# Patient Record
Sex: Female | Born: 1945 | Race: White | Hispanic: No | Marital: Married | Smoking: Never smoker
Health system: Southern US, Community
[De-identification: ages and names within clinical notes are randomized; demographics above are authoritative.]

## PROBLEM LIST (undated history)

## (undated) DIAGNOSIS — J302 Other seasonal allergic rhinitis: Secondary | ICD-10-CM

## (undated) DIAGNOSIS — Z9221 Personal history of antineoplastic chemotherapy: Secondary | ICD-10-CM

## (undated) DIAGNOSIS — Z923 Personal history of irradiation: Secondary | ICD-10-CM

## (undated) DIAGNOSIS — G250 Essential tremor: Secondary | ICD-10-CM

## (undated) DIAGNOSIS — C50919 Malignant neoplasm of unspecified site of unspecified female breast: Secondary | ICD-10-CM

## (undated) DIAGNOSIS — D649 Anemia, unspecified: Secondary | ICD-10-CM

## (undated) DIAGNOSIS — I10 Essential (primary) hypertension: Secondary | ICD-10-CM

## (undated) DIAGNOSIS — Z8489 Family history of other specified conditions: Secondary | ICD-10-CM

## (undated) DIAGNOSIS — E7800 Pure hypercholesterolemia, unspecified: Secondary | ICD-10-CM

## (undated) DIAGNOSIS — M1990 Unspecified osteoarthritis, unspecified site: Secondary | ICD-10-CM

## (undated) HISTORY — DX: Malignant neoplasm of unspecified site of unspecified female breast: C50.919

## (undated) HISTORY — DX: Other seasonal allergic rhinitis: J30.2

## (undated) HISTORY — DX: Personal history of antineoplastic chemotherapy: Z92.21

## (undated) HISTORY — DX: Essential tremor: G25.0

## (undated) HISTORY — PX: DILATION AND CURETTAGE OF UTERUS: SHX78

## (undated) HISTORY — PX: OTHER SURGICAL HISTORY: SHX169

## (undated) HISTORY — PX: BREAST RECONSTRUCTION: SHX9

## (undated) HISTORY — DX: Pure hypercholesterolemia, unspecified: E78.00

## (undated) HISTORY — DX: Essential (primary) hypertension: I10

## (undated) HISTORY — PX: WISDOM TOOTH EXTRACTION: SHX21

## (undated) HISTORY — DX: Personal history of irradiation: Z92.3

---

## 1998-01-18 ENCOUNTER — Ambulatory Visit (HOSPITAL_COMMUNITY): Admission: RE | Admit: 1998-01-18 | Discharge: 1998-01-18 | Payer: Self-pay | Admitting: Obstetrics and Gynecology

## 1998-01-18 ENCOUNTER — Encounter: Payer: Self-pay | Admitting: Obstetrics and Gynecology

## 1998-11-27 ENCOUNTER — Other Ambulatory Visit: Admission: RE | Admit: 1998-11-27 | Discharge: 1998-11-27 | Payer: Self-pay | Admitting: Obstetrics and Gynecology

## 1999-02-07 ENCOUNTER — Encounter: Payer: Self-pay | Admitting: Obstetrics and Gynecology

## 1999-02-07 ENCOUNTER — Ambulatory Visit (HOSPITAL_COMMUNITY): Admission: RE | Admit: 1999-02-07 | Discharge: 1999-02-07 | Payer: Self-pay | Admitting: Obstetrics and Gynecology

## 2000-02-21 ENCOUNTER — Encounter: Payer: Self-pay | Admitting: Obstetrics and Gynecology

## 2000-02-21 ENCOUNTER — Ambulatory Visit (HOSPITAL_COMMUNITY): Admission: RE | Admit: 2000-02-21 | Discharge: 2000-02-21 | Payer: Self-pay | Admitting: Obstetrics and Gynecology

## 2000-02-28 ENCOUNTER — Encounter: Admission: RE | Admit: 2000-02-28 | Discharge: 2000-02-28 | Payer: Self-pay | Admitting: Obstetrics and Gynecology

## 2000-02-28 ENCOUNTER — Encounter: Payer: Self-pay | Admitting: Obstetrics and Gynecology

## 2000-04-30 ENCOUNTER — Other Ambulatory Visit: Admission: RE | Admit: 2000-04-30 | Discharge: 2000-04-30 | Payer: Self-pay | Admitting: Obstetrics and Gynecology

## 2000-10-09 ENCOUNTER — Encounter: Admission: RE | Admit: 2000-10-09 | Discharge: 2000-10-09 | Payer: Self-pay | Admitting: Obstetrics and Gynecology

## 2000-10-09 ENCOUNTER — Encounter: Payer: Self-pay | Admitting: Obstetrics and Gynecology

## 2001-03-13 ENCOUNTER — Encounter: Admission: RE | Admit: 2001-03-13 | Discharge: 2001-03-13 | Payer: Self-pay | Admitting: Obstetrics and Gynecology

## 2001-03-13 ENCOUNTER — Encounter: Payer: Self-pay | Admitting: Obstetrics and Gynecology

## 2001-06-25 ENCOUNTER — Other Ambulatory Visit: Admission: RE | Admit: 2001-06-25 | Discharge: 2001-06-25 | Payer: Self-pay | Admitting: Obstetrics and Gynecology

## 2002-09-03 ENCOUNTER — Encounter: Admission: RE | Admit: 2002-09-03 | Discharge: 2002-09-03 | Payer: Self-pay | Admitting: Obstetrics and Gynecology

## 2002-09-03 ENCOUNTER — Encounter: Payer: Self-pay | Admitting: Obstetrics and Gynecology

## 2002-09-21 ENCOUNTER — Encounter: Payer: Self-pay | Admitting: Obstetrics and Gynecology

## 2002-09-21 ENCOUNTER — Encounter (INDEPENDENT_AMBULATORY_CARE_PROVIDER_SITE_OTHER): Payer: Self-pay | Admitting: Specialist

## 2002-09-21 ENCOUNTER — Encounter: Admission: RE | Admit: 2002-09-21 | Discharge: 2002-09-21 | Payer: Self-pay | Admitting: Obstetrics and Gynecology

## 2002-09-29 ENCOUNTER — Encounter: Payer: Self-pay | Admitting: Surgery

## 2002-09-29 ENCOUNTER — Ambulatory Visit (HOSPITAL_COMMUNITY): Admission: RE | Admit: 2002-09-29 | Discharge: 2002-09-29 | Payer: Self-pay | Admitting: Surgery

## 2002-09-30 ENCOUNTER — Encounter: Payer: Self-pay | Admitting: Surgery

## 2002-10-15 ENCOUNTER — Encounter: Payer: Self-pay | Admitting: Surgery

## 2002-10-15 ENCOUNTER — Encounter: Admission: RE | Admit: 2002-10-15 | Discharge: 2002-10-15 | Payer: Self-pay | Admitting: Surgery

## 2002-10-18 ENCOUNTER — Encounter (INDEPENDENT_AMBULATORY_CARE_PROVIDER_SITE_OTHER): Payer: Self-pay | Admitting: *Deleted

## 2002-10-18 ENCOUNTER — Ambulatory Visit (HOSPITAL_BASED_OUTPATIENT_CLINIC_OR_DEPARTMENT_OTHER): Admission: RE | Admit: 2002-10-18 | Discharge: 2002-10-18 | Payer: Self-pay | Admitting: Surgery

## 2002-10-18 ENCOUNTER — Encounter: Admission: RE | Admit: 2002-10-18 | Discharge: 2002-10-18 | Payer: Self-pay | Admitting: Surgery

## 2002-10-18 ENCOUNTER — Encounter: Payer: Self-pay | Admitting: Surgery

## 2002-10-18 HISTORY — PX: BREAST LUMPECTOMY: SHX2

## 2002-11-02 ENCOUNTER — Encounter: Admission: RE | Admit: 2002-11-02 | Discharge: 2002-11-02 | Payer: Self-pay

## 2002-11-23 ENCOUNTER — Ambulatory Visit: Admission: RE | Admit: 2002-11-23 | Discharge: 2003-02-02 | Payer: Self-pay | Admitting: Radiation Oncology

## 2002-11-25 ENCOUNTER — Other Ambulatory Visit: Admission: RE | Admit: 2002-11-25 | Discharge: 2002-11-25 | Payer: Self-pay | Admitting: Obstetrics and Gynecology

## 2003-02-22 ENCOUNTER — Ambulatory Visit: Admission: RE | Admit: 2003-02-22 | Discharge: 2003-02-22 | Payer: Self-pay | Admitting: Radiation Oncology

## 2003-03-14 ENCOUNTER — Ambulatory Visit: Admission: RE | Admit: 2003-03-14 | Discharge: 2003-03-14 | Payer: Self-pay | Admitting: Radiation Oncology

## 2003-03-15 ENCOUNTER — Ambulatory Visit: Admission: RE | Admit: 2003-03-15 | Discharge: 2003-03-15 | Payer: Self-pay | Admitting: Radiation Oncology

## 2003-08-16 ENCOUNTER — Ambulatory Visit: Admission: RE | Admit: 2003-08-16 | Discharge: 2003-08-16 | Payer: Self-pay | Admitting: Radiation Oncology

## 2004-02-06 ENCOUNTER — Ambulatory Visit (HOSPITAL_COMMUNITY): Admission: RE | Admit: 2004-02-06 | Discharge: 2004-02-06 | Payer: Self-pay | Admitting: Obstetrics and Gynecology

## 2004-02-10 ENCOUNTER — Encounter: Admission: RE | Admit: 2004-02-10 | Discharge: 2004-02-10 | Payer: Self-pay | Admitting: Obstetrics and Gynecology

## 2004-03-02 ENCOUNTER — Encounter: Admission: RE | Admit: 2004-03-02 | Discharge: 2004-03-02 | Payer: Self-pay | Admitting: Obstetrics and Gynecology

## 2005-04-30 ENCOUNTER — Encounter: Admission: RE | Admit: 2005-04-30 | Discharge: 2005-04-30 | Payer: Self-pay | Admitting: Surgery

## 2006-05-02 ENCOUNTER — Encounter: Admission: RE | Admit: 2006-05-02 | Discharge: 2006-05-02 | Payer: Self-pay | Admitting: Surgery

## 2006-05-09 ENCOUNTER — Encounter: Admission: RE | Admit: 2006-05-09 | Discharge: 2006-05-09 | Payer: Self-pay | Admitting: Family Medicine

## 2006-06-19 ENCOUNTER — Ambulatory Visit (HOSPITAL_BASED_OUTPATIENT_CLINIC_OR_DEPARTMENT_OTHER): Admission: RE | Admit: 2006-06-19 | Discharge: 2006-06-19 | Payer: Self-pay | Admitting: Surgery

## 2006-06-19 ENCOUNTER — Encounter (INDEPENDENT_AMBULATORY_CARE_PROVIDER_SITE_OTHER): Payer: Self-pay | Admitting: *Deleted

## 2006-06-23 ENCOUNTER — Ambulatory Visit: Payer: Self-pay | Admitting: Oncology

## 2006-06-24 ENCOUNTER — Ambulatory Visit: Admission: RE | Admit: 2006-06-24 | Discharge: 2006-09-10 | Payer: Self-pay | Admitting: Radiation Oncology

## 2006-07-22 LAB — CBC WITH DIFFERENTIAL/PLATELET
Basophils Absolute: 0 10*3/uL (ref 0.0–0.1)
Eosinophils Absolute: 0.1 10*3/uL (ref 0.0–0.5)
HCT: 39.3 % (ref 34.8–46.6)
HGB: 14 g/dL (ref 11.6–15.9)
MCV: 85.7 fL (ref 81.0–101.0)
MONO%: 6.7 % (ref 0.0–13.0)
NEUT#: 3.8 10*3/uL (ref 1.5–6.5)
RDW: 13.6 % (ref 11.3–14.5)

## 2006-07-22 LAB — COMPREHENSIVE METABOLIC PANEL
Albumin: 4.7 g/dL (ref 3.5–5.2)
Alkaline Phosphatase: 80 U/L (ref 39–117)
BUN: 29 mg/dL — ABNORMAL HIGH (ref 6–23)
Calcium: 10.1 mg/dL (ref 8.4–10.5)
Chloride: 105 mEq/L (ref 96–112)
Glucose, Bld: 99 mg/dL (ref 70–99)
Potassium: 5.5 mEq/L — ABNORMAL HIGH (ref 3.5–5.3)

## 2006-07-29 LAB — COMPREHENSIVE METABOLIC PANEL
Alkaline Phosphatase: 75 U/L (ref 39–117)
BUN: 19 mg/dL (ref 6–23)
Glucose, Bld: 110 mg/dL — ABNORMAL HIGH (ref 70–99)
Sodium: 140 mEq/L (ref 135–145)
Total Bilirubin: 0.8 mg/dL (ref 0.3–1.2)
Total Protein: 6.9 g/dL (ref 6.0–8.3)

## 2006-08-01 ENCOUNTER — Encounter: Admission: RE | Admit: 2006-08-01 | Discharge: 2006-08-01 | Payer: Self-pay | Admitting: Oncology

## 2006-08-11 ENCOUNTER — Ambulatory Visit: Payer: Self-pay | Admitting: Oncology

## 2006-09-04 ENCOUNTER — Ambulatory Visit (HOSPITAL_COMMUNITY): Admission: RE | Admit: 2006-09-04 | Discharge: 2006-09-05 | Payer: Self-pay | Admitting: Surgery

## 2006-09-04 ENCOUNTER — Encounter (INDEPENDENT_AMBULATORY_CARE_PROVIDER_SITE_OTHER): Payer: Self-pay | Admitting: Surgery

## 2006-09-04 HISTORY — PX: MASTECTOMY: SHX3

## 2006-09-26 ENCOUNTER — Ambulatory Visit: Payer: Self-pay | Admitting: Oncology

## 2006-10-02 ENCOUNTER — Ambulatory Visit (HOSPITAL_COMMUNITY): Admission: RE | Admit: 2006-10-02 | Discharge: 2006-10-02 | Payer: Self-pay | Admitting: Oncology

## 2006-10-02 LAB — CANCER ANTIGEN 27.29: CA 27.29: 10 U/mL (ref 0–39)

## 2006-10-02 LAB — COMPREHENSIVE METABOLIC PANEL
Alkaline Phosphatase: 82 U/L (ref 39–117)
BUN: 31 mg/dL — ABNORMAL HIGH (ref 6–23)
Glucose, Bld: 104 mg/dL — ABNORMAL HIGH (ref 70–99)
Total Bilirubin: 0.8 mg/dL (ref 0.3–1.2)

## 2006-10-02 LAB — CBC WITH DIFFERENTIAL/PLATELET
Basophils Absolute: 0 10*3/uL (ref 0.0–0.1)
Eosinophils Absolute: 0.1 10*3/uL (ref 0.0–0.5)
HGB: 13.1 g/dL (ref 11.6–15.9)
LYMPH%: 30.6 % (ref 14.0–48.0)
MCV: 85.9 fL (ref 81.0–101.0)
MONO%: 6.7 % (ref 0.0–13.0)
NEUT#: 2.9 10*3/uL (ref 1.5–6.5)
Platelets: 192 10*3/uL (ref 145–400)

## 2006-10-03 ENCOUNTER — Ambulatory Visit (HOSPITAL_BASED_OUTPATIENT_CLINIC_OR_DEPARTMENT_OTHER): Admission: RE | Admit: 2006-10-03 | Discharge: 2006-10-03 | Payer: Self-pay | Admitting: Surgery

## 2006-10-06 ENCOUNTER — Ambulatory Visit: Payer: Self-pay

## 2006-10-10 ENCOUNTER — Ambulatory Visit (HOSPITAL_COMMUNITY): Admission: RE | Admit: 2006-10-10 | Discharge: 2006-10-10 | Payer: Self-pay | Admitting: Oncology

## 2006-10-24 LAB — CBC WITH DIFFERENTIAL/PLATELET
BASO%: 1.1 % (ref 0.0–2.0)
EOS%: 0.3 % (ref 0.0–7.0)
HCT: 38.5 % (ref 34.8–46.6)
MCH: 30.2 pg (ref 26.0–34.0)
MCHC: 36 g/dL (ref 32.0–36.0)
MONO#: 1.7 10*3/uL — ABNORMAL HIGH (ref 0.1–0.9)
NEUT%: 78.2 % — ABNORMAL HIGH (ref 39.6–76.8)
RDW: 10.1 % — ABNORMAL LOW (ref 11.3–14.5)
WBC: 19.1 10*3/uL — ABNORMAL HIGH (ref 3.9–10.0)
lymph#: 2.3 10*3/uL (ref 0.9–3.3)

## 2006-10-31 LAB — CBC WITH DIFFERENTIAL/PLATELET
Basophils Absolute: 0.1 10*3/uL (ref 0.0–0.1)
EOS%: 0.5 % (ref 0.0–7.0)
Eosinophils Absolute: 0 10*3/uL (ref 0.0–0.5)
HCT: 33.6 % — ABNORMAL LOW (ref 34.8–46.6)
HGB: 12.3 g/dL (ref 11.6–15.9)
MCH: 30.5 pg (ref 26.0–34.0)
MONO#: 0.4 10*3/uL (ref 0.1–0.9)
NEUT#: 6 10*3/uL (ref 1.5–6.5)
NEUT%: 77.5 % — ABNORMAL HIGH (ref 39.6–76.8)
RDW: 10.3 % — ABNORMAL LOW (ref 11.3–14.5)
WBC: 7.7 10*3/uL (ref 3.9–10.0)
lymph#: 1.2 10*3/uL (ref 0.9–3.3)

## 2006-11-07 LAB — COMPREHENSIVE METABOLIC PANEL
Albumin: 4.4 g/dL (ref 3.5–5.2)
Alkaline Phosphatase: 83 U/L (ref 39–117)
BUN: 21 mg/dL (ref 6–23)
Calcium: 9.6 mg/dL (ref 8.4–10.5)
Chloride: 107 mEq/L (ref 96–112)
Creatinine, Ser: 0.71 mg/dL (ref 0.40–1.20)
Glucose, Bld: 118 mg/dL — ABNORMAL HIGH (ref 70–99)
Potassium: 4.6 mEq/L (ref 3.5–5.3)

## 2006-11-07 LAB — CBC WITH DIFFERENTIAL/PLATELET
Basophils Absolute: 0 10*3/uL (ref 0.0–0.1)
Eosinophils Absolute: 0 10*3/uL (ref 0.0–0.5)
HCT: 32.2 % — ABNORMAL LOW (ref 34.8–46.6)
HGB: 11.3 g/dL — ABNORMAL LOW (ref 11.6–15.9)
MCH: 29.7 pg (ref 26.0–34.0)
MCV: 84.6 fL (ref 81.0–101.0)
MONO%: 2.9 % (ref 0.0–13.0)
NEUT#: 9.2 10*3/uL — ABNORMAL HIGH (ref 1.5–6.5)
NEUT%: 89.2 % — ABNORMAL HIGH (ref 39.6–76.8)
RDW: 11.4 % (ref 11.3–14.5)

## 2006-11-12 ENCOUNTER — Ambulatory Visit: Payer: Self-pay | Admitting: Oncology

## 2006-11-14 LAB — CBC WITH DIFFERENTIAL/PLATELET
Basophils Absolute: 0 10*3/uL (ref 0.0–0.1)
Eosinophils Absolute: 0 10*3/uL (ref 0.0–0.5)
HCT: UNDETERMINED % (ref 34.8–46.6)
HGB: 11.5 g/dL — ABNORMAL LOW (ref 11.6–15.9)
NEUT#: 0.4 10*3/uL — CL (ref 1.5–6.5)
RDW: 10.9 % — ABNORMAL LOW (ref 11.3–14.5)
lymph#: 0.7 10*3/uL — ABNORMAL LOW (ref 0.9–3.3)

## 2006-11-21 LAB — CBC WITH DIFFERENTIAL/PLATELET
Basophils Absolute: 0 10*3/uL (ref 0.0–0.1)
Eosinophils Absolute: 0 10*3/uL (ref 0.0–0.5)
HCT: 28.3 % — ABNORMAL LOW (ref 34.8–46.6)
HGB: 10.3 g/dL — ABNORMAL LOW (ref 11.6–15.9)
LYMPH%: 53.7 % — ABNORMAL HIGH (ref 14.0–48.0)
MCV: 84.1 fL (ref 81.0–101.0)
MONO%: 24.3 % — ABNORMAL HIGH (ref 0.0–13.0)
NEUT#: 0.3 10*3/uL — CL (ref 1.5–6.5)
NEUT%: 17.9 % — ABNORMAL LOW (ref 39.6–76.8)
Platelets: 128 10*3/uL — ABNORMAL LOW (ref 145–400)

## 2006-12-05 LAB — COMPREHENSIVE METABOLIC PANEL
Albumin: 4.4 g/dL (ref 3.5–5.2)
Alkaline Phosphatase: 80 U/L (ref 39–117)
BUN: 26 mg/dL — ABNORMAL HIGH (ref 6–23)
Calcium: 9 mg/dL (ref 8.4–10.5)
Chloride: 109 mEq/L (ref 96–112)
Glucose, Bld: 137 mg/dL — ABNORMAL HIGH (ref 70–99)
Potassium: 4.5 mEq/L (ref 3.5–5.3)
Sodium: 141 mEq/L (ref 135–145)
Total Protein: 6.6 g/dL (ref 6.0–8.3)

## 2006-12-05 LAB — CBC WITH DIFFERENTIAL/PLATELET
Basophils Absolute: 0 10*3/uL (ref 0.0–0.1)
Eosinophils Absolute: 0 10*3/uL (ref 0.0–0.5)
HGB: 11.5 g/dL — ABNORMAL LOW (ref 11.6–15.9)
MONO#: 0.1 10*3/uL (ref 0.1–0.9)
MONO%: 1.9 % (ref 0.0–13.0)
NEUT#: 4.2 10*3/uL (ref 1.5–6.5)
RBC: 3.61 10*6/uL — ABNORMAL LOW (ref 3.70–5.32)
RDW: 14.4 % (ref 11.3–14.5)
WBC: 4.7 10*3/uL (ref 3.9–10.0)
lymph#: 0.4 10*3/uL — ABNORMAL LOW (ref 0.9–3.3)

## 2006-12-12 LAB — CBC WITH DIFFERENTIAL/PLATELET
Basophils Absolute: 0 10*3/uL (ref 0.0–0.1)
EOS%: 1.1 % (ref 0.0–7.0)
Eosinophils Absolute: 0 10*3/uL (ref 0.0–0.5)
HGB: 11.4 g/dL — ABNORMAL LOW (ref 11.6–15.9)
NEUT#: 0.9 10*3/uL — ABNORMAL LOW (ref 1.5–6.5)
RBC: 3.68 10*6/uL — ABNORMAL LOW (ref 3.70–5.32)
RDW: 12.4 % (ref 11.3–14.5)
lymph#: 0.9 10*3/uL (ref 0.9–3.3)

## 2006-12-19 LAB — CBC WITH DIFFERENTIAL/PLATELET
Basophils Absolute: 0 10*3/uL (ref 0.0–0.1)
Eosinophils Absolute: 0 10*3/uL (ref 0.0–0.5)
HGB: 10.7 g/dL — ABNORMAL LOW (ref 11.6–15.9)
MCV: 89.5 fL (ref 81.0–101.0)
MONO#: 0.3 10*3/uL (ref 0.1–0.9)
MONO%: 4.2 % (ref 0.0–13.0)
NEUT#: 6.4 10*3/uL (ref 1.5–6.5)
Platelets: 79 10*3/uL — ABNORMAL LOW (ref 145–400)
RBC: 3.36 10*6/uL — ABNORMAL LOW (ref 3.70–5.32)
RDW: 16.7 % — ABNORMAL HIGH (ref 11.3–14.5)
WBC: 7.8 10*3/uL (ref 3.9–10.0)

## 2006-12-19 LAB — COMPREHENSIVE METABOLIC PANEL
Albumin: 4.2 g/dL (ref 3.5–5.2)
Alkaline Phosphatase: 97 U/L (ref 39–117)
BUN: 16 mg/dL (ref 6–23)
CO2: 26 mEq/L (ref 19–32)
Calcium: 8.5 mg/dL (ref 8.4–10.5)
Glucose, Bld: 88 mg/dL (ref 70–99)
Potassium: 4.3 mEq/L (ref 3.5–5.3)
Sodium: 144 mEq/L (ref 135–145)
Total Protein: 6 g/dL (ref 6.0–8.3)

## 2006-12-26 LAB — CBC WITH DIFFERENTIAL/PLATELET
Basophils Absolute: 0 10*3/uL (ref 0.0–0.1)
Eosinophils Absolute: 0 10*3/uL (ref 0.0–0.5)
HGB: 9.6 g/dL — ABNORMAL LOW (ref 11.6–15.9)
LYMPH%: 4.5 % — ABNORMAL LOW (ref 14.0–48.0)
MCV: 88.7 fL (ref 81.0–101.0)
MONO#: 0.2 10*3/uL (ref 0.1–0.9)
NEUT#: 11.2 10*3/uL — ABNORMAL HIGH (ref 1.5–6.5)
Platelets: 333 10*3/uL (ref 145–400)
RBC: 3.06 10*6/uL — ABNORMAL LOW (ref 3.70–5.32)
RDW: 13.8 % (ref 11.3–14.5)
WBC: 12 10*3/uL — ABNORMAL HIGH (ref 3.9–10.0)

## 2006-12-26 LAB — COMPREHENSIVE METABOLIC PANEL
Albumin: 4.1 g/dL (ref 3.5–5.2)
BUN: 25 mg/dL — ABNORMAL HIGH (ref 6–23)
CO2: 20 mEq/L (ref 19–32)
Glucose, Bld: 145 mg/dL — ABNORMAL HIGH (ref 70–99)
Potassium: 4.6 mEq/L (ref 3.5–5.3)
Sodium: 139 mEq/L (ref 135–145)
Total Protein: 6.1 g/dL (ref 6.0–8.3)

## 2006-12-26 LAB — LACTATE DEHYDROGENASE: LDH: 259 U/L — ABNORMAL HIGH (ref 94–250)

## 2006-12-30 ENCOUNTER — Ambulatory Visit: Payer: Self-pay | Admitting: Oncology

## 2007-01-01 LAB — CBC WITH DIFFERENTIAL/PLATELET
BASO%: 0.3 % (ref 0.0–2.0)
Eosinophils Absolute: 0 10*3/uL (ref 0.0–0.5)
MCHC: 36 g/dL (ref 32.0–36.0)
MONO#: 0.7 10*3/uL (ref 0.1–0.9)
NEUT#: 12.2 10*3/uL — ABNORMAL HIGH (ref 1.5–6.5)
RBC: 3.21 10*6/uL — ABNORMAL LOW (ref 3.70–5.32)
RDW: 17.7 % — ABNORMAL HIGH (ref 11.3–14.5)
WBC: 14.1 10*3/uL — ABNORMAL HIGH (ref 3.9–10.0)
lymph#: 1.1 10*3/uL (ref 0.9–3.3)

## 2007-01-13 ENCOUNTER — Ambulatory Visit: Admission: RE | Admit: 2007-01-13 | Discharge: 2007-01-13 | Payer: Self-pay | Admitting: Oncology

## 2007-01-13 ENCOUNTER — Encounter: Payer: Self-pay | Admitting: Oncology

## 2007-01-16 LAB — COMPREHENSIVE METABOLIC PANEL
ALT: 25 U/L (ref 0–35)
Albumin: 3.2 g/dL — ABNORMAL LOW (ref 3.5–5.2)
CO2: 15 mEq/L — ABNORMAL LOW (ref 19–32)
Calcium: 6.9 mg/dL — ABNORMAL LOW (ref 8.4–10.5)
Chloride: 118 mEq/L — ABNORMAL HIGH (ref 96–112)
Potassium: 3.4 mEq/L — ABNORMAL LOW (ref 3.5–5.3)
Sodium: 144 mEq/L (ref 135–145)
Total Bilirubin: 0.8 mg/dL (ref 0.3–1.2)
Total Protein: 4.5 g/dL — ABNORMAL LOW (ref 6.0–8.3)

## 2007-01-16 LAB — CBC WITH DIFFERENTIAL/PLATELET
BASO%: 0.5 % (ref 0.0–2.0)
Basophils Absolute: 0 10*3/uL (ref 0.0–0.1)
EOS%: 0.4 % (ref 0.0–7.0)
HGB: 9.6 g/dL — ABNORMAL LOW (ref 11.6–15.9)
MCH: 32.5 pg (ref 26.0–34.0)
MCHC: 36.4 g/dL — ABNORMAL HIGH (ref 32.0–36.0)
MCV: 89.4 fL (ref 81.0–101.0)
MONO%: 10.5 % (ref 0.0–13.0)
RBC: 2.94 10*6/uL — ABNORMAL LOW (ref 3.70–5.32)
RDW: 14.6 % — ABNORMAL HIGH (ref 11.3–14.5)
lymph#: 1.3 10*3/uL (ref 0.9–3.3)

## 2007-01-16 LAB — LACTATE DEHYDROGENASE: LDH: 169 U/L (ref 94–250)

## 2007-02-06 LAB — CBC WITH DIFFERENTIAL/PLATELET
Basophils Absolute: 0 10*3/uL (ref 0.0–0.1)
Eosinophils Absolute: 0.1 10*3/uL (ref 0.0–0.5)
HGB: 11.6 g/dL (ref 11.6–15.9)
MONO#: 0.2 10*3/uL (ref 0.1–0.9)
MONO%: 6 % (ref 0.0–13.0)
NEUT#: 2.5 10*3/uL (ref 1.5–6.5)
RBC: 3.55 10*6/uL — ABNORMAL LOW (ref 3.70–5.32)
RDW: 11.8 % (ref 11.3–14.5)
WBC: 3.9 10*3/uL (ref 3.9–10.0)
lymph#: 1.1 10*3/uL (ref 0.9–3.3)

## 2007-02-06 LAB — COMPREHENSIVE METABOLIC PANEL
AST: 22 U/L (ref 0–37)
Albumin: 4.1 g/dL (ref 3.5–5.2)
Alkaline Phosphatase: 72 U/L (ref 39–117)
BUN: 29 mg/dL — ABNORMAL HIGH (ref 6–23)
Calcium: 9.1 mg/dL (ref 8.4–10.5)
Chloride: 107 mEq/L (ref 96–112)
Glucose, Bld: 84 mg/dL (ref 70–99)
Potassium: 4.5 mEq/L (ref 3.5–5.3)
Sodium: 139 mEq/L (ref 135–145)
Total Protein: 6.4 g/dL (ref 6.0–8.3)

## 2007-02-24 ENCOUNTER — Ambulatory Visit: Payer: Self-pay | Admitting: Oncology

## 2007-02-27 LAB — COMPREHENSIVE METABOLIC PANEL
Albumin: 4.4 g/dL (ref 3.5–5.2)
Alkaline Phosphatase: 86 U/L (ref 39–117)
BUN: 17 mg/dL (ref 6–23)
CO2: 22 mEq/L (ref 19–32)
Calcium: 9.2 mg/dL (ref 8.4–10.5)
Glucose, Bld: 94 mg/dL (ref 70–99)
Potassium: 4.4 mEq/L (ref 3.5–5.3)
Sodium: 140 mEq/L (ref 135–145)
Total Protein: 6.6 g/dL (ref 6.0–8.3)

## 2007-02-27 LAB — CBC WITH DIFFERENTIAL/PLATELET
BASO%: 0.6 % (ref 0.0–2.0)
LYMPH%: 22.1 % (ref 14.0–48.0)
MCHC: 36.3 g/dL — ABNORMAL HIGH (ref 32.0–36.0)
MCV: 87.7 fL (ref 81.0–101.0)
MONO%: 6.5 % (ref 0.0–13.0)
NEUT#: 3 10*3/uL (ref 1.5–6.5)
Platelets: 172 10*3/uL (ref 145–400)
RBC: 3.8 10*6/uL (ref 3.70–5.32)
RDW: 10 % — ABNORMAL LOW (ref 11.3–14.5)
WBC: 4.3 10*3/uL (ref 3.9–10.0)

## 2007-02-27 LAB — LACTATE DEHYDROGENASE: LDH: 192 U/L (ref 94–250)

## 2007-03-20 LAB — CBC WITH DIFFERENTIAL/PLATELET
BASO%: 0 % (ref 0.0–2.0)
Eosinophils Absolute: 0.1 10*3/uL (ref 0.0–0.5)
LYMPH%: 21.7 % (ref 14.0–48.0)
MCHC: 36.1 g/dL — ABNORMAL HIGH (ref 32.0–36.0)
MONO#: 0.3 10*3/uL (ref 0.1–0.9)
NEUT#: 2.7 10*3/uL (ref 1.5–6.5)
Platelets: 166 10*3/uL (ref 145–400)
RBC: 3.67 10*6/uL — ABNORMAL LOW (ref 3.70–5.32)
RDW: 13.3 % (ref 11.3–14.5)
WBC: 3.9 10*3/uL (ref 3.9–10.0)
lymph#: 0.8 10*3/uL — ABNORMAL LOW (ref 0.9–3.3)

## 2007-03-20 LAB — COMPREHENSIVE METABOLIC PANEL
ALT: 22 U/L (ref 0–35)
Albumin: 4.1 g/dL (ref 3.5–5.2)
CO2: 23 mEq/L (ref 19–32)
Chloride: 105 mEq/L (ref 96–112)
Glucose, Bld: 98 mg/dL (ref 70–99)
Potassium: 3.9 mEq/L (ref 3.5–5.3)
Sodium: 140 mEq/L (ref 135–145)
Total Bilirubin: 0.7 mg/dL (ref 0.3–1.2)
Total Protein: 6.1 g/dL (ref 6.0–8.3)

## 2007-03-20 LAB — CANCER ANTIGEN 27.29: CA 27.29: 11 U/mL (ref 0–39)

## 2007-04-13 ENCOUNTER — Ambulatory Visit: Payer: Self-pay | Admitting: Oncology

## 2007-04-15 LAB — CBC WITH DIFFERENTIAL/PLATELET
BASO%: 0.3 % (ref 0.0–2.0)
EOS%: 1.8 % (ref 0.0–7.0)
MCHC: 36.7 g/dL — ABNORMAL HIGH (ref 32.0–36.0)
MONO#: 0.3 10*3/uL (ref 0.1–0.9)
RBC: 3.86 10*6/uL (ref 3.70–5.32)
RDW: 13.4 % (ref 11.3–14.5)
WBC: 4.6 10*3/uL (ref 3.9–10.0)
lymph#: 1 10*3/uL (ref 0.9–3.3)

## 2007-04-15 LAB — COMPREHENSIVE METABOLIC PANEL
ALT: 42 U/L — ABNORMAL HIGH (ref 0–35)
AST: 25 U/L (ref 0–37)
Albumin: 4.3 g/dL (ref 3.5–5.2)
CO2: 22 mEq/L (ref 19–32)
Chloride: 105 mEq/L (ref 96–112)
Sodium: 140 mEq/L (ref 135–145)
Total Protein: 6.6 g/dL (ref 6.0–8.3)

## 2007-04-23 ENCOUNTER — Encounter: Admission: RE | Admit: 2007-04-23 | Discharge: 2007-04-23 | Payer: Self-pay | Admitting: Oncology

## 2007-05-07 LAB — COMPREHENSIVE METABOLIC PANEL
ALT: 23 U/L (ref 0–35)
Albumin: 4.6 g/dL (ref 3.5–5.2)
CO2: 24 mEq/L (ref 19–32)
Calcium: 9.2 mg/dL (ref 8.4–10.5)
Chloride: 106 mEq/L (ref 96–112)
Creatinine, Ser: 0.83 mg/dL (ref 0.40–1.20)
Potassium: 4.1 mEq/L (ref 3.5–5.3)
Sodium: 140 mEq/L (ref 135–145)
Total Protein: 6.5 g/dL (ref 6.0–8.3)

## 2007-05-07 LAB — CBC WITH DIFFERENTIAL/PLATELET
BASO%: 0.5 % (ref 0.0–2.0)
HCT: 34.6 % — ABNORMAL LOW (ref 34.8–46.6)
HGB: 12.4 g/dL (ref 11.6–15.9)
MCHC: 35.7 g/dL (ref 32.0–36.0)
MONO#: 0.3 10*3/uL (ref 0.1–0.9)
NEUT%: 63.6 % (ref 39.6–76.8)
WBC: 4.9 10*3/uL (ref 3.9–10.0)
lymph#: 1.3 10*3/uL (ref 0.9–3.3)

## 2007-05-08 ENCOUNTER — Ambulatory Visit: Payer: Self-pay

## 2007-05-08 ENCOUNTER — Encounter: Payer: Self-pay | Admitting: Oncology

## 2007-05-14 ENCOUNTER — Ambulatory Visit (HOSPITAL_COMMUNITY): Admission: RE | Admit: 2007-05-14 | Discharge: 2007-05-14 | Payer: Self-pay | Admitting: Oncology

## 2007-05-29 ENCOUNTER — Ambulatory Visit: Payer: Self-pay | Admitting: Oncology

## 2007-06-19 LAB — CBC WITH DIFFERENTIAL/PLATELET
Basophils Absolute: 0.1 10*3/uL (ref 0.0–0.1)
Eosinophils Absolute: 0.1 10*3/uL (ref 0.0–0.5)
HCT: 34.2 % — ABNORMAL LOW (ref 34.8–46.6)
HGB: 12.1 g/dL (ref 11.6–15.9)
LYMPH%: 27.2 % (ref 14.0–48.0)
MONO#: 0.3 10*3/uL (ref 0.1–0.9)
NEUT#: 2.9 10*3/uL (ref 1.5–6.5)
Platelets: 192 10*3/uL (ref 145–400)
RBC: 4.03 10*6/uL (ref 3.70–5.32)
WBC: 4.7 10*3/uL (ref 3.9–10.0)

## 2007-06-19 LAB — COMPREHENSIVE METABOLIC PANEL
Albumin: 4.5 g/dL (ref 3.5–5.2)
BUN: 24 mg/dL — ABNORMAL HIGH (ref 6–23)
CO2: 20 mEq/L (ref 19–32)
Glucose, Bld: 102 mg/dL — ABNORMAL HIGH (ref 70–99)
Sodium: 139 mEq/L (ref 135–145)
Total Bilirubin: 1 mg/dL (ref 0.3–1.2)
Total Protein: 6.8 g/dL (ref 6.0–8.3)

## 2007-06-23 ENCOUNTER — Encounter: Admission: RE | Admit: 2007-06-23 | Discharge: 2007-06-23 | Payer: Self-pay | Admitting: Family Medicine

## 2007-07-10 LAB — CBC WITH DIFFERENTIAL/PLATELET
Basophils Absolute: 0 10*3/uL (ref 0.0–0.1)
Eosinophils Absolute: 0.1 10*3/uL (ref 0.0–0.5)
HGB: 12.1 g/dL (ref 11.6–15.9)
MONO#: 0.4 10*3/uL (ref 0.1–0.9)
MONO%: 8.3 % (ref 0.0–13.0)
NEUT#: 2.9 10*3/uL (ref 1.5–6.5)
RBC: 3.99 10*6/uL (ref 3.70–5.32)
RDW: 12.1 % (ref 11.3–14.5)
WBC: 4.5 10*3/uL (ref 3.9–10.0)
lymph#: 1.1 10*3/uL (ref 0.9–3.3)

## 2007-07-10 LAB — COMPREHENSIVE METABOLIC PANEL
Albumin: 4.1 g/dL (ref 3.5–5.2)
BUN: 19 mg/dL (ref 6–23)
CO2: 23 mEq/L (ref 19–32)
Calcium: 9.5 mg/dL (ref 8.4–10.5)
Glucose, Bld: 95 mg/dL (ref 70–99)
Potassium: 4.2 mEq/L (ref 3.5–5.3)
Sodium: 140 mEq/L (ref 135–145)
Total Protein: 6.4 g/dL (ref 6.0–8.3)

## 2007-07-29 ENCOUNTER — Ambulatory Visit: Payer: Self-pay | Admitting: Oncology

## 2007-07-29 IMAGING — CR DG CHEST 1V PORT
1 series · 1 of 1 positions shown · non-contrast
Comparison: Yesterday.

CLINICAL DATA: Status post right mastectomy for breast cancer. Shortness of
breath.

PORTABLE CHEST - 1 VIEW

[view not recorded]
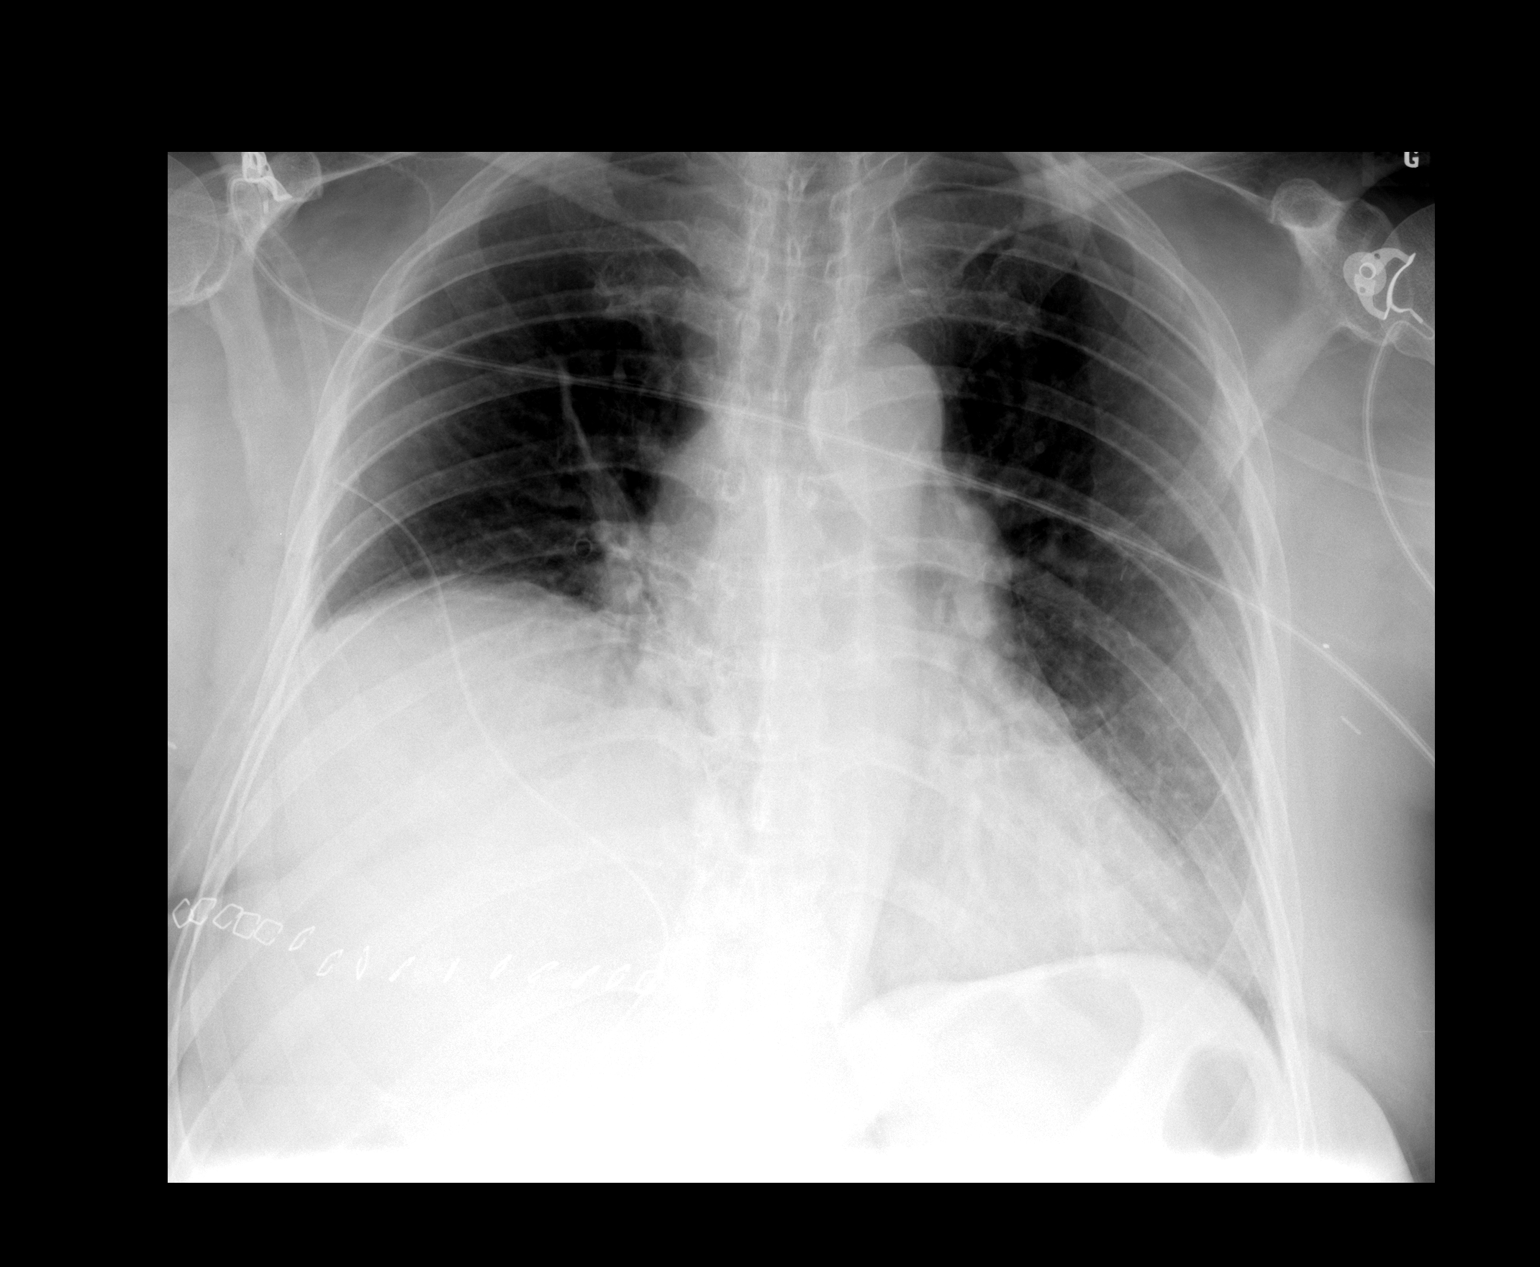

[1 of 1 positions shown; findings below may reference images not displayed]

FINDINGS: Stable elevation of the right hemidiaphragm. Stable minimal right
pleural thickening or fluid. Right perihilar subsegmental atelectasis. Minimal
scoliosis. Interval right post mastectomy changes with a surgical drain. Stable
left inferior axillary surgical clip and tiny metallic density.  

IMPRESSION

1. Interval mild right perihilar atelectasis.
2. Stable elevated right hemidiaphragm and minimal right pleural thickening or
fluid.
3. Stable mild bronchitic changes.
4. Interval right postmastectomy changes with a surgical drain.

## 2007-07-31 LAB — CBC WITH DIFFERENTIAL/PLATELET
BASO%: 0.6 % (ref 0.0–2.0)
EOS%: 2.7 % (ref 0.0–7.0)
HGB: 12.6 g/dL (ref 11.6–15.9)
MCH: 31 pg (ref 26.0–34.0)
MCHC: 36.2 g/dL — ABNORMAL HIGH (ref 32.0–36.0)
MONO#: 0.4 10*3/uL (ref 0.1–0.9)
RDW: 12 % (ref 11.3–14.5)
WBC: 5.1 10*3/uL (ref 3.9–10.0)
lymph#: 1.2 10*3/uL (ref 0.9–3.3)

## 2007-07-31 LAB — COMPREHENSIVE METABOLIC PANEL
ALT: 30 U/L (ref 0–35)
AST: 22 U/L (ref 0–37)
Albumin: 4.2 g/dL (ref 3.5–5.2)
BUN: 24 mg/dL — ABNORMAL HIGH (ref 6–23)
CO2: 21 mEq/L (ref 19–32)
Calcium: 9.5 mg/dL (ref 8.4–10.5)
Chloride: 107 mEq/L (ref 96–112)
Creatinine, Ser: 0.82 mg/dL (ref 0.40–1.20)
Potassium: 3.9 mEq/L (ref 3.5–5.3)

## 2007-08-18 ENCOUNTER — Encounter: Payer: Self-pay | Admitting: Oncology

## 2007-08-18 ENCOUNTER — Ambulatory Visit: Admission: RE | Admit: 2007-08-18 | Discharge: 2007-08-18 | Payer: Self-pay | Admitting: Oncology

## 2007-08-21 LAB — CBC WITH DIFFERENTIAL/PLATELET
Basophils Absolute: 0 10*3/uL (ref 0.0–0.1)
Eosinophils Absolute: 0.1 10*3/uL (ref 0.0–0.5)
HGB: 12.2 g/dL (ref 11.6–15.9)
LYMPH%: 34.6 % (ref 14.0–48.0)
MCV: 85.5 fL (ref 81.0–101.0)
MONO#: 0.4 10*3/uL (ref 0.1–0.9)
MONO%: 10 % (ref 0.0–13.0)
NEUT#: 2.2 10*3/uL (ref 1.5–6.5)
Platelets: 170 10*3/uL (ref 145–400)
RBC: 4.02 10*6/uL (ref 3.70–5.32)
RDW: 12.3 % (ref 11.3–14.5)
WBC: 4.2 10*3/uL (ref 3.9–10.0)

## 2007-09-09 ENCOUNTER — Ambulatory Visit: Payer: Self-pay | Admitting: Oncology

## 2007-09-11 LAB — CBC WITH DIFFERENTIAL/PLATELET
BASO%: 0.5 % (ref 0.0–2.0)
Basophils Absolute: 0 10*3/uL (ref 0.0–0.1)
EOS%: 2.1 % (ref 0.0–7.0)
HCT: 34.2 % — ABNORMAL LOW (ref 34.8–46.6)
HGB: 12.3 g/dL (ref 11.6–15.9)
LYMPH%: 29.6 % (ref 14.0–48.0)
MCH: 30.4 pg (ref 26.0–34.0)
MCHC: 35.9 g/dL (ref 32.0–36.0)
MCV: 84.8 fL (ref 81.0–101.0)
MONO%: 8.2 % (ref 0.0–13.0)
NEUT%: 59.7 % (ref 39.6–76.8)
Platelets: 164 10*3/uL (ref 145–400)

## 2007-09-11 LAB — COMPREHENSIVE METABOLIC PANEL
ALT: 45 U/L — ABNORMAL HIGH (ref 0–35)
AST: 32 U/L (ref 0–37)
BUN: 19 mg/dL (ref 6–23)
Calcium: 10.1 mg/dL (ref 8.4–10.5)
Chloride: 105 mEq/L (ref 96–112)
Creatinine, Ser: 0.81 mg/dL (ref 0.40–1.20)
Total Bilirubin: 0.8 mg/dL (ref 0.3–1.2)

## 2007-10-02 LAB — CBC WITH DIFFERENTIAL/PLATELET
Eosinophils Absolute: 0.1 10*3/uL (ref 0.0–0.5)
HCT: 34.9 % (ref 34.8–46.6)
LYMPH%: 32.9 % (ref 14.0–48.0)
MCHC: 36 g/dL (ref 32.0–36.0)
MCV: 84.7 fL (ref 81.0–101.0)
MONO#: 0.4 10*3/uL (ref 0.1–0.9)
MONO%: 9.3 % (ref 0.0–13.0)
NEUT%: 54.3 % (ref 39.6–76.8)
Platelets: 184 10*3/uL (ref 145–400)
WBC: 3.8 10*3/uL — ABNORMAL LOW (ref 3.9–10.0)

## 2007-10-23 LAB — CBC WITH DIFFERENTIAL/PLATELET
BASO%: 0.5 % (ref 0.0–2.0)
EOS%: 1.8 % (ref 0.0–7.0)
HCT: 34 % — ABNORMAL LOW (ref 34.8–46.6)
LYMPH%: 32.6 % (ref 14.0–48.0)
MCH: 30.4 pg (ref 26.0–34.0)
MCHC: 36 g/dL (ref 32.0–36.0)
MONO%: 6.8 % (ref 0.0–13.0)
NEUT%: 58.3 % (ref 39.6–76.8)
Platelets: 157 10*3/uL (ref 145–400)
RBC: 4.03 10*6/uL (ref 3.70–5.32)

## 2007-10-23 LAB — COMPREHENSIVE METABOLIC PANEL
ALT: 25 U/L (ref 0–35)
AST: 21 U/L (ref 0–37)
Alkaline Phosphatase: 66 U/L (ref 39–117)
Creatinine, Ser: 0.98 mg/dL (ref 0.40–1.20)
Total Bilirubin: 0.6 mg/dL (ref 0.3–1.2)

## 2007-10-23 LAB — LACTATE DEHYDROGENASE: LDH: 206 U/L (ref 94–250)

## 2007-10-23 LAB — CANCER ANTIGEN 27.29: CA 27.29: 20 U/mL (ref 0–39)

## 2007-11-17 ENCOUNTER — Ambulatory Visit: Admission: RE | Admit: 2007-11-17 | Discharge: 2007-11-17 | Payer: Self-pay | Admitting: Oncology

## 2007-11-17 ENCOUNTER — Encounter: Payer: Self-pay | Admitting: Oncology

## 2008-01-22 ENCOUNTER — Ambulatory Visit: Payer: Self-pay | Admitting: Oncology

## 2008-01-26 LAB — CBC WITH DIFFERENTIAL/PLATELET
BASO%: 0.5 % (ref 0.0–2.0)
Basophils Absolute: 0 10*3/uL (ref 0.0–0.1)
EOS%: 2 % (ref 0.0–7.0)
HGB: 12.1 g/dL (ref 11.6–15.9)
MCH: 30.6 pg (ref 26.0–34.0)
MCHC: 34.8 g/dL (ref 32.0–36.0)
MCV: 88 fL (ref 81.0–101.0)
MONO%: 7 % (ref 0.0–13.0)
RBC: 3.97 10*6/uL (ref 3.70–5.32)
RDW: 13.5 % (ref 11.3–14.5)
lymph#: 1.2 10*3/uL (ref 0.9–3.3)

## 2008-01-27 LAB — COMPREHENSIVE METABOLIC PANEL
ALT: 22 U/L (ref 0–35)
AST: 18 U/L (ref 0–37)
Albumin: 4.6 g/dL (ref 3.5–5.2)
Alkaline Phosphatase: 60 U/L (ref 39–117)
Calcium: 9.7 mg/dL (ref 8.4–10.5)
Chloride: 104 mEq/L (ref 96–112)
Potassium: 5.1 mEq/L (ref 3.5–5.3)
Sodium: 138 mEq/L (ref 135–145)
Total Protein: 6.7 g/dL (ref 6.0–8.3)

## 2008-05-23 ENCOUNTER — Ambulatory Visit: Payer: Self-pay | Admitting: Oncology

## 2008-05-26 LAB — COMPREHENSIVE METABOLIC PANEL
ALT: 31 U/L (ref 0–35)
BUN: 26 mg/dL — ABNORMAL HIGH (ref 6–23)
CO2: 26 mEq/L (ref 19–32)
Creatinine, Ser: 1.23 mg/dL — ABNORMAL HIGH (ref 0.40–1.20)
Total Bilirubin: 0.9 mg/dL (ref 0.3–1.2)

## 2008-05-26 LAB — LACTATE DEHYDROGENASE: LDH: 150 U/L (ref 94–250)

## 2008-05-26 LAB — CBC WITH DIFFERENTIAL/PLATELET
BASO%: 0.5 % (ref 0.0–2.0)
Basophils Absolute: 0 10*3/uL (ref 0.0–0.1)
HCT: 33.2 % — ABNORMAL LOW (ref 34.8–46.6)
LYMPH%: 33.5 % (ref 14.0–49.7)
MCH: 30.3 pg (ref 25.1–34.0)
MCHC: 35.4 g/dL (ref 31.5–36.0)
MONO#: 0.3 10*3/uL (ref 0.1–0.9)
NEUT%: 56.4 % (ref 38.4–76.8)
Platelets: 198 10*3/uL (ref 145–400)
WBC: 4.3 10*3/uL (ref 3.9–10.3)

## 2008-05-27 LAB — CANCER ANTIGEN 27.29: CA 27.29: 6 U/mL (ref 0–39)

## 2008-05-27 LAB — VITAMIN D 25 HYDROXY (VIT D DEFICIENCY, FRACTURES): Vit D, 25-Hydroxy: 33 ng/mL (ref 30–89)

## 2008-10-11 ENCOUNTER — Ambulatory Visit: Payer: Self-pay | Admitting: Oncology

## 2009-04-07 ENCOUNTER — Ambulatory Visit: Payer: Self-pay | Admitting: Oncology

## 2009-04-11 LAB — CBC WITH DIFFERENTIAL/PLATELET
BASO%: 0.5 % (ref 0.0–2.0)
EOS%: 2.7 % (ref 0.0–7.0)
HGB: 13.1 g/dL (ref 11.6–15.9)
MCH: 30.5 pg (ref 25.1–34.0)
MCHC: 34.7 g/dL (ref 31.5–36.0)
RBC: 4.3 10*6/uL (ref 3.70–5.45)
RDW: 13.6 % (ref 11.2–14.5)
lymph#: 1.6 10*3/uL (ref 0.9–3.3)

## 2009-04-11 LAB — COMPREHENSIVE METABOLIC PANEL
ALT: 20 U/L (ref 0–35)
AST: 20 U/L (ref 0–37)
Albumin: 4.4 g/dL (ref 3.5–5.2)
Calcium: 10 mg/dL (ref 8.4–10.5)
Chloride: 106 mEq/L (ref 96–112)
Potassium: 4.9 mEq/L (ref 3.5–5.3)
Sodium: 141 mEq/L (ref 135–145)

## 2009-05-03 ENCOUNTER — Encounter: Admission: RE | Admit: 2009-05-03 | Discharge: 2009-05-03 | Payer: Self-pay | Admitting: Oncology

## 2009-10-10 ENCOUNTER — Ambulatory Visit: Payer: Self-pay | Admitting: Oncology

## 2009-10-10 LAB — COMPREHENSIVE METABOLIC PANEL
Albumin: 4.5 g/dL (ref 3.5–5.2)
Alkaline Phosphatase: 68 U/L (ref 39–117)
BUN: 19 mg/dL (ref 6–23)
Calcium: 10 mg/dL (ref 8.4–10.5)
Glucose, Bld: 120 mg/dL — ABNORMAL HIGH (ref 70–99)
Potassium: 4.3 mEq/L (ref 3.5–5.3)

## 2009-10-10 LAB — CBC WITH DIFFERENTIAL/PLATELET
Basophils Absolute: 0 10*3/uL (ref 0.0–0.1)
EOS%: 2.1 % (ref 0.0–7.0)
HCT: 37.6 % (ref 34.8–46.6)
HGB: 12.9 g/dL (ref 11.6–15.9)
MCH: 30.1 pg (ref 25.1–34.0)
MCV: 87.9 fL (ref 79.5–101.0)
NEUT%: 58.6 % (ref 38.4–76.8)
Platelets: 118 10*3/uL — ABNORMAL LOW (ref 145–400)
lymph#: 1.7 10*3/uL (ref 0.9–3.3)

## 2009-10-10 LAB — CANCER ANTIGEN 27.29: CA 27.29: 11 U/mL (ref 0–39)

## 2009-10-10 LAB — VITAMIN D 25 HYDROXY (VIT D DEFICIENCY, FRACTURES): Vit D, 25-Hydroxy: 52 ng/mL (ref 30–89)

## 2010-03-17 ENCOUNTER — Encounter: Payer: Self-pay | Admitting: Obstetrics and Gynecology

## 2010-03-18 ENCOUNTER — Encounter: Payer: Self-pay | Admitting: Oncology

## 2010-03-18 ENCOUNTER — Encounter: Payer: Self-pay | Admitting: Family Medicine

## 2010-04-12 ENCOUNTER — Other Ambulatory Visit: Payer: Self-pay | Admitting: Oncology

## 2010-04-12 ENCOUNTER — Encounter (HOSPITAL_BASED_OUTPATIENT_CLINIC_OR_DEPARTMENT_OTHER): Payer: 59 | Admitting: Oncology

## 2010-04-12 DIAGNOSIS — Z170 Estrogen receptor positive status [ER+]: Secondary | ICD-10-CM

## 2010-04-12 DIAGNOSIS — C50419 Malignant neoplasm of upper-outer quadrant of unspecified female breast: Secondary | ICD-10-CM

## 2010-04-12 LAB — CBC WITH DIFFERENTIAL/PLATELET
BASO%: 0.4 % (ref 0.0–2.0)
EOS%: 2.5 % (ref 0.0–7.0)
LYMPH%: 29.8 % (ref 14.0–49.7)
MCHC: 34.7 g/dL (ref 31.5–36.0)
MCV: 87.5 fL (ref 79.5–101.0)
MONO%: 7.7 % (ref 0.0–14.0)
Platelets: 171 10*3/uL (ref 145–400)
RBC: 4.13 10*6/uL (ref 3.70–5.45)
RDW: 13.3 % (ref 11.2–14.5)
WBC: 4.9 10*3/uL (ref 3.9–10.3)

## 2010-04-13 LAB — COMPREHENSIVE METABOLIC PANEL
ALT: 24 U/L (ref 0–35)
AST: 18 U/L (ref 0–37)
Alkaline Phosphatase: 72 U/L (ref 39–117)
Sodium: 140 mEq/L (ref 135–145)
Total Bilirubin: 0.7 mg/dL (ref 0.3–1.2)
Total Protein: 6.2 g/dL (ref 6.0–8.3)

## 2010-04-13 LAB — VITAMIN D 25 HYDROXY (VIT D DEFICIENCY, FRACTURES): Vit D, 25-Hydroxy: 52 ng/mL (ref 30–89)

## 2010-04-19 ENCOUNTER — Encounter: Payer: 59 | Admitting: Oncology

## 2010-07-10 NOTE — Op Note (Signed)
NAME:  Carol Levy, Carol Levy NO.:  [id number]   MEDICAL RECORD NO.:  [id number]          PATIENT TYPE:  OIB   LOCATION:  5704                         FACILITY:  [facility]   PHYSICIAN:  Currie Paris, M.D.DATE OF BIRTH:  [birth year]   DATE OF PROCEDURE:  09/04/2006  DATE OF DISCHARGE:  09/05/2006                               OPERATIVE REPORT   PREOPERATIVE DIAGNOSIS:  Ductal carcinoma in situ, right breast, status  post lumpectomy with positive margins.   POSTOPERATIVE DIAGNOSIS:  Ductal carcinoma in situ, right breast, status  post lumpectomy with positive margins.   OPERATION:  1. Right total mastectomy with blue dye injection.  2. Axillary sentinel lymph node biopsy.   SURGEON:  Currie Paris, M.D.   ASSISTANT:  Velora Heckler, M.D.   ANESTHESIA:  General.   CLINICAL HISTORY:  Mrs. Common is a 61-year lady who is status post  lumpectomy and radiation therapy for a left breast invasive cancer.  She  has now developed and a fairly extensive area of DCIS in the right  breast and we had already done a lumpectomy with significant positivity  margins and after lengthy discussion and consultation with Radiation  Therapy and the patient, we elected to proceed to a mastectomy.  She  plans a subsequent reconstruction by a Engineer, petroleum.  Because we were  doing a mastectomy, we elected to do a sentinel node in case invasive  cancer is found on the final pathology.   DESCRIPTION OF PROCEDURE:  The patient was seen in the holding area and  she had no further questions.  The right breast was identified and  marked as the operative side.  She already had had the radioactive  isotope injected.   The patient was taken to the operating room and after satisfactory  general anesthesia had been obtained, the right nipple-areolar area was  prepped and the time-out occurred.  I injected 5 mL of dilute methylene  blue; this was massaged in.  The breast was prepped and  draped as a  sterile field.  An elliptical incision was outlined.  I made the  superior incision and raised a superior flap to the sternum medially and  clavicle and then out into the axilla.  I had already marked the  overlying skin as a hot area and using NeoProbe, found an axillary lymph  node that was about a centimeter and a half, but soft, blue and with  counts of about 700 and this was removed.  No other blue nodes or hot  axillary areas, etc, were noticed.  No palpable adenopathy was noticed.   Attention was turned back to the breast and the inferior skin incision  made and inferior flap raised going to inframammary fold and laterally  to the latissimus.  The breast was removed from medial to lateral,  taking the breast off of the muscle, but keeping the muscle intact.  I  opened the clavipectoral fascia and then divided the lateral inferior  attachments off of the serratus and latissimus.  Then with the breast  elevated, I was able to divide the  attachments of the breast up into the  axilla without getting into the axilla and doing any further node  removal.   I spent several minutes irrigating, making sure everything was dry.  It  placed two 19 Blake drains and secured them with nylon sutures.  I then  irrigated a final time and again made the last check for hemostasis and  again, everything appeared dry.  The skin was then closed with staples.   The patient tolerated the procedure well and there no operative  complications.  All counts were correct.      Currie Paris, M.D.  Electronically Signed     CJS/MEDQ  D:  09/04/2006  T:  09/05/2006  Job:  [id number]

## 2010-07-10 NOTE — Op Note (Signed)
NAME:  Carol Levy, Carol Levy              ACCOUNT NO.:  [id number]   MEDICAL RECORD NO.:  [id number]          PATIENT TYPE:  AMB   LOCATION:  DSC                          FACILITY:  [facility]   PHYSICIAN:  Currie Paris, M.D.DATE OF BIRTH:  Oct 14, 1945   DATE OF PROCEDURE:  10/03/2006  DATE OF DISCHARGE:                               OPERATIVE REPORT   OFFICE MEDICAL RECORD NUMBER [facility] [phone].   PREOPERATIVE DIAGNOSES:  1. carcinoma the breast with inadequate venous access for      chemotherapy.  2. Right post-mastectomy axillary seroma.   POSTOPERATIVE DIAGNOSES:  1. carcinoma the breast with inadequate venous access for      chemotherapy.  2. Right post-mastectomy axillary seroma.   OPERATION:  1. Placement of Port-A-Cath.  2. Drain in seroma.   SURGEON:  Currie Paris, M.D.   ANESTHESIA:  MAC.   CLINICAL HISTORY:  This patient is a 65 year old lady status post a  right mastectomy.  She will need chemo and has inadequate venous access,  so a port placement was planned.  In addition, I have aspirated a small  seroma in the right axilla and we elected to place a drain in it today  as well.   DESCRIPTION OF PROCEDURE:  The patient was seen in the holding area and  had no further questions.  We marked the right axilla as the area for  the drain placement.  The patient was taken to the operating room and  given IV sedation.  The upper chest and lower neck were prepped and  draped as a sterile field.  The patient was placed in Trendelenburg.   I infiltrated the left infraclavicular area and made multiple attempts  to enter the left subclavian vein from several slightly different angles  and positions but never got even a hint of blood return.  After multiple  attempts, I elected to move to the right side.  She is status post  recent right mastectomy and I had tried to put this on the left but  without ability to get access, I decided to place it on the right.  The  area had  healed nicely and I thought it would be safe to do so.   The right infraclavicular area was infiltrated with Xylocaine and on the  first attempt got good return of blood and threaded the guidewire easily  into the superior vena cava-right atrial area.   Additional local was infiltrated over the skin flap and a transverse  incision made.  A skin flap was raised for the port reservoir.   The Port-A-Cath tubing was brought between the guidewire site and the  reservoir site.  The guidewire site was dilated once with the dilator  and peel-away sheath and the guidewire and dilator were removed.  The  catheter threaded easily to 20 cm.  It aspirated and irrigated easily.   Using fluoro I backed this up to 14 cm, where I thought I was in the  distal superior vena cava.  Again it aspirated and flushed easily.  The  reservoir was attached and locking mechanism engaged.  This also  aspirated and flushed easily.   The reservoir was placed in its pocket and sutured down with two sutures  of 2-0 Prolene.  A final fluoro showed good positioning with no kinks  and no evidence of a pneumothorax on either side.   The incision was then closed with some 3-0 Vicryl, 4-0 Monocryl  subcuticular and Dermabond.   The patient was repositioned with the right arm at her side and the area  of the lateral aspect of her mastectomy scar prepped and draped.  I  injected some Xylocaine and made a small incision for drain placement.  I was able to use a hemostat to get into a small pocket and placed a 10  round [place] drain in and secured it with a 2-0 nylon.  Sterile  dressings were then applied here.   The patient tolerated the procedure well and there were no  complications.  All counts were correct.      Currie Paris, M.D.  Electronically Signed     CJS/MEDQ  D:  10/03/2006  T:  10/04/2006  Job:  [id number]

## 2010-12-03 ENCOUNTER — Encounter (HOSPITAL_BASED_OUTPATIENT_CLINIC_OR_DEPARTMENT_OTHER): Payer: 59 | Admitting: Oncology

## 2010-12-03 ENCOUNTER — Other Ambulatory Visit: Payer: Self-pay | Admitting: Physician Assistant

## 2010-12-03 DIAGNOSIS — Z170 Estrogen receptor positive status [ER+]: Secondary | ICD-10-CM

## 2010-12-03 DIAGNOSIS — C50419 Malignant neoplasm of upper-outer quadrant of unspecified female breast: Secondary | ICD-10-CM

## 2010-12-03 LAB — CBC WITH DIFFERENTIAL/PLATELET
BASO%: 0.3 % (ref 0.0–2.0)
EOS%: 1.9 % (ref 0.0–7.0)
HCT: 39.1 % (ref 34.8–46.6)
LYMPH%: 22.3 % (ref 14.0–49.7)
MCH: 30.9 pg (ref 25.1–34.0)
MCHC: 34.7 g/dL (ref 31.5–36.0)
NEUT%: 67.8 % (ref 38.4–76.8)
RBC: 4.4 10*6/uL (ref 3.70–5.45)
WBC: 7.3 10*3/uL (ref 3.9–10.3)
lymph#: 1.6 10*3/uL (ref 0.9–3.3)

## 2010-12-03 LAB — COMPREHENSIVE METABOLIC PANEL
ALT: 35 U/L (ref 0–35)
AST: 26 U/L (ref 0–37)
Alkaline Phosphatase: 83 U/L (ref 39–117)
BUN: 23 mg/dL (ref 6–23)
Chloride: 105 mEq/L (ref 96–112)
Creatinine, Ser: 0.9 mg/dL (ref 0.50–1.10)
Potassium: 4.2 mEq/L (ref 3.5–5.3)

## 2010-12-10 LAB — POCT HEMOGLOBIN-HEMACUE
Hemoglobin: 13.7
Operator id: 208731

## 2010-12-11 ENCOUNTER — Encounter (HOSPITAL_BASED_OUTPATIENT_CLINIC_OR_DEPARTMENT_OTHER): Payer: 59 | Admitting: Oncology

## 2010-12-11 DIAGNOSIS — C50419 Malignant neoplasm of upper-outer quadrant of unspecified female breast: Secondary | ICD-10-CM

## 2010-12-11 DIAGNOSIS — Z170 Estrogen receptor positive status [ER+]: Secondary | ICD-10-CM

## 2010-12-11 LAB — BASIC METABOLIC PANEL
BUN: 14
Calcium: 9.2
GFR calc non Af Amer: >60
Potassium: 4.1

## 2010-12-11 LAB — URINALYSIS, ROUTINE W REFLEX MICROSCOPIC
Bilirubin Urine: NEGATIVE
Hgb urine dipstick: NEGATIVE
Ketones, ur: NEGATIVE
Protein, ur: NEGATIVE
Urobilinogen, UA: 0.2

## 2010-12-11 LAB — DIFFERENTIAL
Basophils Relative: 0
Eosinophils Absolute: 0.1
Monocytes Absolute: 0.3
Monocytes Relative: 7

## 2010-12-11 LAB — CBC
HCT: 37.7
MCHC: 34.4
WBC: 5.1

## 2010-12-12 ENCOUNTER — Other Ambulatory Visit: Payer: Self-pay | Admitting: Oncology

## 2010-12-12 DIAGNOSIS — Z853 Personal history of malignant neoplasm of breast: Secondary | ICD-10-CM

## 2011-01-23 NOTE — Progress Notes (Signed)
CC:   Dario Guardian, M.D. Billie Lade, Ph.D., M.D. Michelle L. Vincente Poli, M.D. Currie Paris, M.D.  PROBLEM:  History of breast cancer 2004, right breast diagnosed 2008, status post right modified radical mastectomy, HER-2 positive, ER/PR positive, status post 4 cycles of TCH given with q.3 week Herceptin, previously on Arimidex, now on Femara.  [name] returns for follow up otherwise doing well.  She continues to work full time in the [place] [employer]. Appetite good. Weight is stable.  Denies headaches, blurred vision, shortness of breath or cough.  ECOG status is 0.  MEDICATIONS:  Reviewed.  There have been no changes.  Continues on Femara which she tolerates fairly well with minimal hot flashes and some hand pain.  Most recent mammogram performed at [facility] in September did not reveal any abnormalities.  PHYSICAL EXAMINATION:  Very pleasant alert woman appearing stated age. Vital signs:  Not recorded.  Head and neck:  No palpable adenopathy. Trachea midline.  No thyromegaly. Lungs:  Clear.  Heart:  Sounds are normal.  Right breast status post TRAM flap, left breast normal.  Axilla negative for adenopathy.  There is no obvious skin change, no nipple retraction. Abdomen:  Soft.  No palpable hepatosplenomegaly.  No inguinal adenopathy. No peripheral cyanosis, clubbing or edema.  Neurologic exam is grossly intact.  LABORATORY DATA:  From 12/03/2010:  There are no specific abnormalities seen.  Tumor marker normal.  Vitamin D level is not currently available.  IMPRESSION AND PLAN:  [name] is doing well.  Plan a 6 month follow up with appropriate imaging studies.  Will determine if she is due for a bone density test as well.    ______________________________ Pierce Crane, M.D., F.R.C.P.C. PR/MEDQ  D:  01/23/2011  T:  01/23/2011  Job:  [id number]

## 2011-04-01 ENCOUNTER — Telehealth: Payer: Self-pay | Admitting: *Deleted

## 2011-04-01 NOTE — Telephone Encounter (Signed)
left mesage informing the patient of the new date and time in April 2013

## 2011-05-09 ENCOUNTER — Other Ambulatory Visit: Payer: 59

## 2011-05-14 ENCOUNTER — Ambulatory Visit
Admission: RE | Admit: 2011-05-14 | Discharge: 2011-05-14 | Disposition: A | Discharge status: Home / Self Care | Payer: 59 | Source: Ambulatory Visit | Attending: Oncology | Admitting: Oncology

## 2011-05-14 DIAGNOSIS — Z853 Personal history of malignant neoplasm of breast: Secondary | ICD-10-CM

## 2011-05-21 ENCOUNTER — Telehealth: Payer: Self-pay | Admitting: *Deleted

## 2011-05-21 NOTE — Telephone Encounter (Signed)
patient confirmed over the phone patient will come in on 06-10-2011 at 2:30pm

## 2011-06-04 ENCOUNTER — Other Ambulatory Visit (HOSPITAL_BASED_OUTPATIENT_CLINIC_OR_DEPARTMENT_OTHER): Payer: Medicare Other | Admitting: Lab

## 2011-06-04 DIAGNOSIS — Z5112 Encounter for antineoplastic immunotherapy: Secondary | ICD-10-CM

## 2011-06-04 DIAGNOSIS — Z170 Estrogen receptor positive status [ER+]: Secondary | ICD-10-CM

## 2011-06-04 DIAGNOSIS — M899 Disorder of bone, unspecified: Secondary | ICD-10-CM

## 2011-06-04 DIAGNOSIS — C50419 Malignant neoplasm of upper-outer quadrant of unspecified female breast: Secondary | ICD-10-CM

## 2011-06-04 LAB — CBC WITH DIFFERENTIAL/PLATELET
BASO%: 0.5 % (ref 0.0–2.0)
Eosinophils Absolute: 0.1 10*3/uL (ref 0.0–0.5)
LYMPH%: 29 % (ref 14.0–49.7)
MCHC: 34.1 g/dL (ref 31.5–36.0)
MCV: 85.1 fL (ref 79.5–101.0)
MONO%: 5.1 % (ref 0.0–14.0)
Platelets: 160 10*3/uL (ref 145–400)
RBC: 4.35 10*6/uL (ref 3.70–5.45)

## 2011-06-05 LAB — COMPREHENSIVE METABOLIC PANEL
Alkaline Phosphatase: 76 U/L (ref 39–117)
Glucose, Bld: 112 mg/dL — ABNORMAL HIGH (ref 70–99)
Sodium: 142 mEq/L (ref 135–145)
Total Bilirubin: 1 mg/dL (ref 0.3–1.2)
Total Protein: 6.1 g/dL (ref 6.0–8.3)

## 2011-06-10 ENCOUNTER — Telehealth: Payer: Self-pay | Admitting: Oncology

## 2011-06-10 ENCOUNTER — Ambulatory Visit (HOSPITAL_BASED_OUTPATIENT_CLINIC_OR_DEPARTMENT_OTHER): Payer: 59 | Admitting: Oncology

## 2011-06-10 VITALS — BP 133/77 | HR 87 | Temp 98.5°F | Ht 66.5 in | Wt 168.0 lb

## 2011-06-10 DIAGNOSIS — Z9010 Acquired absence of unspecified breast and nipple: Secondary | ICD-10-CM

## 2011-06-10 DIAGNOSIS — C50919 Malignant neoplasm of unspecified site of unspecified female breast: Secondary | ICD-10-CM

## 2011-06-10 DIAGNOSIS — E559 Vitamin D deficiency, unspecified: Secondary | ICD-10-CM

## 2011-06-10 DIAGNOSIS — C50419 Malignant neoplasm of upper-outer quadrant of unspecified female breast: Secondary | ICD-10-CM

## 2011-06-10 NOTE — Progress Notes (Signed)
Hematology and Oncology Follow Up Visit  Carol Levy [id number] [birth year] 66 y.o. 06/10/2011 3:42 PM PCP  Principle Diagnosis: 66 yo with hx of breast cancer in 2004, and in 2008, s/p bilateral mastectomy , 2nd s/p TCH x 4 cycles and herceptin for 1 year, currently on femara   Interim History:  There have been no intercurrent illness, hospitalizations or medication changes. She is now retired and her mother is living with her.  Medications: I have reviewed the patient's current medications.  Allergies: No Known Allergies  Past Medical History, Surgical history, Social history, and Family History were reviewed and updated.  Review of Systems: Constitutional:  Negative for fever, chills, night sweats, anorexia, weight loss, pain. Cardiovascular: no chest pain or dyspnea on exertion Respiratory: no cough, shortness of breath, or wheezing Neurological: negative Dermatological: negative ENT: negative Skin Gastrointestinal: negative Genito-Urinary: negative Hematological and Lymphatic: negative Breast: negative Musculoskeletal: negative Remaining ROS negative.  Physical Exam: Blood pressure 133/77, pulse 87, temperature 98.5 F (36.9 C), temperature source Oral, height 5' 6.5" (1.689 m), weight 168 lb (76.204 kg). ECOG:  General appearance: alert, cooperative and appears stated age Head: Normocephalic, without obvious abnormality, atraumatic Neck: no adenopathy, no carotid bruit, no JVD, supple, symmetrical, trachea midline and thyroid not enlarged, symmetric, no tenderness/mass/nodules Lymph nodes: Cervical, supraclavicular, and axillary nodes normal. Cardiac : regular rate and rhythm, no murmurs or gallops Pulmonary:clear to auscultation bilaterally and normal percussion bilaterally Breasts: inspection negative, no nipple discharge or bleeding, no masses or nodularity palpable and s/p bilateral mrm with reconstruction.. NED Abdomen:soft, non-tender; bowel sounds normal; no  masses,  no organomegaly Extremities negative Neuro: alert, oriented, normal speech, no focal findings or movement disorder noted  Lab Results: Lab Results  Component Value Date   WBC 3.9 06/04/2011   HGB 12.6 06/04/2011   HCT 37.0 06/04/2011   MCV 85.1 06/04/2011   PLT 160 06/04/2011     Chemistry      Component Value Date/Time   NA 142 06/04/2011 1014   K 3.9 06/04/2011 1014   CL 109 06/04/2011 1014   CO2 23 06/04/2011 1014   BUN 21 06/04/2011 1014   CREATININE 0.72 06/04/2011 1014      Component Value Date/Time   CALCIUM 9.6 06/04/2011 1014   ALKPHOS 76 06/04/2011 1014   AST 31 06/04/2011 1014   ALT 33 06/04/2011 1014   BILITOT 1.0 06/04/2011 1014      .pathology. Radiological Studies: chest X-ray n/a Mammogram n/a Bone density Latest - wnl  Impression and Plan: Carol Levy is doing well, I will see her in 1 yr and asked that she d/c femara when she has completed this rx in October.  More than 50% of the visit was spent in patient-related counselling   Pierce Crane, MD 4/15/20133:42 PM

## 2011-06-10 NOTE — Telephone Encounter (Signed)
S/w the pt and she is aware of her yearly 2014 appts for april

## 2011-06-13 ENCOUNTER — Ambulatory Visit: Payer: 59 | Admitting: Oncology

## 2011-12-24 ENCOUNTER — Other Ambulatory Visit: Payer: Self-pay | Admitting: Oncology

## 2011-12-24 ENCOUNTER — Telehealth: Payer: Self-pay | Admitting: *Deleted

## 2011-12-24 DIAGNOSIS — Z1231 Encounter for screening mammogram for malignant neoplasm of breast: Secondary | ICD-10-CM

## 2011-12-24 NOTE — Telephone Encounter (Signed)
Patient requested to move her mammogram and have it done at breast center patient confirmed over the phone the new date and time at the breast center 02-04-2012 starting at 10:50

## 2012-01-28 ENCOUNTER — Telehealth: Payer: Self-pay | Admitting: *Deleted

## 2012-01-28 NOTE — Telephone Encounter (Signed)
Mailed out calendar to inform the patient of the new date and time in 05-2012

## 2012-02-04 ENCOUNTER — Ambulatory Visit
Admission: RE | Admit: 2012-02-04 | Discharge: 2012-02-04 | Disposition: A | Discharge status: Home / Self Care | Payer: Medicare Other | Source: Ambulatory Visit | Attending: Oncology | Admitting: Oncology

## 2012-02-04 DIAGNOSIS — Z1231 Encounter for screening mammogram for malignant neoplasm of breast: Secondary | ICD-10-CM

## 2012-02-06 ENCOUNTER — Other Ambulatory Visit: Payer: Self-pay | Admitting: Oncology

## 2012-02-06 DIAGNOSIS — R928 Other abnormal and inconclusive findings on diagnostic imaging of breast: Secondary | ICD-10-CM

## 2012-02-14 ENCOUNTER — Ambulatory Visit
Admission: RE | Admit: 2012-02-14 | Discharge: 2012-02-14 | Disposition: A | Discharge status: Home / Self Care | Payer: Medicare Other | Source: Ambulatory Visit | Attending: Oncology | Admitting: Oncology

## 2012-02-14 DIAGNOSIS — R928 Other abnormal and inconclusive findings on diagnostic imaging of breast: Secondary | ICD-10-CM

## 2012-04-20 ENCOUNTER — Telehealth: Payer: Self-pay | Admitting: *Deleted

## 2012-04-20 NOTE — Telephone Encounter (Signed)
Pt out of town until Thursday 04/23/12.  Will attempt to contact then.

## 2012-04-28 ENCOUNTER — Encounter: Payer: Self-pay | Admitting: *Deleted

## 2012-04-28 NOTE — Progress Notes (Signed)
Awaiting patient response I have cancelled her appts. 

## 2012-04-28 NOTE — Progress Notes (Signed)
Left message, Mailed letter, Awaiting pt response.  

## 2012-04-30 ENCOUNTER — Telehealth: Payer: Self-pay | Admitting: Oncology

## 2012-04-30 NOTE — Telephone Encounter (Signed)
Pt called and left a message asking if anyone cared about her here at all.   She received a letter stating that we have tried to reach her.   She was very angry.   First she lost her doctor and then she feels like we do not care.   I apologized and explained why letters were sent,   I offered to schedule an appt.  But she wants to investigate her options.

## 2012-05-04 ENCOUNTER — Encounter: Payer: Self-pay | Admitting: Oncology

## 2012-05-04 ENCOUNTER — Telehealth: Payer: Self-pay | Admitting: *Deleted

## 2012-05-04 NOTE — Telephone Encounter (Signed)
Pt called stating that she decided to see Dr. Darnelle Catalan.  Confirmed 05/28/12 appt w/ pt.  Mailed letter & calendar to pt.

## 2012-05-28 ENCOUNTER — Ambulatory Visit (HOSPITAL_BASED_OUTPATIENT_CLINIC_OR_DEPARTMENT_OTHER): Payer: Medicare Other | Admitting: Gynecologic Oncology

## 2012-05-28 ENCOUNTER — Encounter: Payer: Self-pay | Admitting: Gynecologic Oncology

## 2012-05-28 VITALS — BP 126/78 | HR 90 | Temp 98.3°F | Resp 20 | Ht 66.0 in | Wt 170.5 lb

## 2012-05-28 DIAGNOSIS — C50919 Malignant neoplasm of unspecified site of unspecified female breast: Secondary | ICD-10-CM | POA: Insufficient documentation

## 2012-05-28 DIAGNOSIS — Z853 Personal history of malignant neoplasm of breast: Secondary | ICD-10-CM

## 2012-05-28 NOTE — Patient Instructions (Signed)
Doing well.  Plan to follow up with Dr. Katrinka Blazing for future care and annual physician breast exams.  Please call for any questions or concerns.

## 2012-05-28 NOTE — Progress Notes (Signed)
ID: [name]   DOB: Jan 07, 1946  MR#: [id number]  CSN#:[id number]  PCP: Allean Found, MD GYN: None SU: Dr. Jamey Ripa OTHER MD:  Dermatology  HISTORY OF PRESENT ILLNESS:  Carol Levy is a 67 year old [place] woman, who noted a suspicious area in her left breast in the summer of 2004.  She subsequently had a mammogram on 09/03/02, which showed a fibroglandular pattern and possible mass in the left breast.  Spot compression views were recommended.  Subsequent mammogram and ultrasound confirmed the presence of a lesion at the 11 o'clock position. 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  [name] subsequently underwent an MRI scan of her right and left breast.  MRI of the right breast was negative.  MRI of the left breast showed an irregular mass slightly lateral to the nipple.  She was referred for further evaluation and subsequently underwent a lumpectomy and sentinel lymph node evaluation on 10/18/02 by Dr. Jamey Ripa.  Final pathology showed a 1.2 cm invasive tubular cancer, grade 1/3.  Surgical margins were negative.  There was an intraductal component seen and no lymphovascular invasion was seen.  A total of 4 sentinel lymph nodes were identified, all of which were negative for metastatic disease.  She underwent radiation therapy from 12/01/02 to 01/19/03 under the care of Dr. Roselind Messier.  She was started on Arimidex in January of 2005 and she took the medication for six months.  On 05/02/06, she had her annual mammogram, which resulted microcalcifications in the upper outer portion of the right breast.  The following area was biopsied on 05/20/06, resulting high grade DCIS with necrosis, ER 1% and PR 0%.  She then underwent a right breast lumpectomy on 06/19/06 by Dr. Jamey Ripa with final pathology revealing high grade DCIS with DCIS focally  involving the deep margin and extending into the lobules.  Then on 09/04/06, she underwent a right mastectomy with sentinel lymph node biopsy with final pathology resulting IDC with DCIS, 1 node with isolated cells, ER 100%, PR 14%, HER2 3+, Ki67 26%.  Beginning in August 2008, she completed 4 cycles of TCH followed by Herceptin for one year, which was completed in August 2009.  She was started on Femara in August of 2010 and discontinued the medication in October 2013.    INTERVAL HISTORY:  She presents today for continued follow up.  No concerns voiced since her last visit.  She reports that she is caring for her 13 year old mother, who moved into her home.  She sees Dr. Merri Brunette for her primary care.  She states that when she started Arimidex in January of 2005, she only took the medication for six months and then she discontinued it herself.  REVIEW OF SYSTEMS: Constitutional: Feels well.  Cardiovascular: No chest pain, shortness of breath, or edema.  Pulmonary: No cough or wheeze.  Gastrointestinal: No nausea, vomiting, or diarrhea. No bright red blood per rectum or change in bowel movement.  Genitourinary: No frequency, urgency, or dysuria. No vaginal bleeding or discharge.  Musculoskeletal: No myalgia or joint pain. Neurologic: No weakness, numbness, or change in gait.  Psychology: No depression, anxiety, or insomnia.  PAST MEDICAL HISTORY: Past Medical History  Diagnosis Date  . S/P radiation therapy     12/01/02 to 01/19/03  . History of chemotherapy     4 cycles TCH  with Herceptin for one year in 2008  . Breast cancer     L breast IDC in 2004, R breast IDC with DCIS in 2008  . Hypertension   . Hypercholesterolemia     PAST SURGICAL HISTORY: Past Surgical History  Procedure Laterality Date  . Breast lumpectomy  10/18/02    left breast  . Breast reconstruction    . Mastectomy  09/04/06  . Dilation and curettage of uterus    . Wisdom tooth extraction      FAMILY  HISTORY Family History  Problem Relation Age of Onset  . Breast cancer Mother   . Melanoma Father     GYNECOLOGIC HISTORY: Menarche at age 44.  She had been on hormone replacement therapy for five years, discontinued in 08/2002.  She had regular periods while on Premphase.  She is gravida 1, para 1.  No history of breast feeding.  No history of birth control pill use.  She has previously used IUD.     SOCIAL HISTORY:  She has been married, previously married for 10 years.  She has one child over 50, who lives in [place].  She has one two year old grandson.  Her current husband runs a lighting company in [place].  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     ADVANCED DIRECTIVES:  Living will  HEALTH MAINTENANCE: History  Substance Use Topics  . Smoking status: Never Smoker   . Smokeless tobacco: Not on file  . Alcohol Use: Yes     Comment: 3 glasses a week     Colonoscopy:  Has not had, advised of the importance  PAP:  3 years ago  Bone density:  05/14/11 with normal results  Lipid panel:  Managed by Dr. Katrinka Blazing  No Known Allergies  Current Outpatient Prescriptions  Medication Sig Dispense Refill  . atenolol (TENORMIN) 50 MG tablet Take 50 mg by mouth daily.      Marland Kitchen atorvastatin (LIPITOR) 80 MG tablet Take 80 mg by mouth daily.      . calcium-vitamin D (OSCAL WITH D) 500-200 MG-UNIT per tablet Take 2 tablets by mouth daily.      . cetirizine (ZYRTEC) 10 MG tablet Take 10 mg by mouth daily.      . cholecalciferol (VITAMIN D) 1000 UNITS tablet Take 1,000 Units by mouth daily.      Marland Kitchen ezetimibe (ZETIA) 10 MG tablet Take 10 mg by mouth daily.      Marland Kitchen lisinopril (PRINIVIL,ZESTRIL) 20 MG tablet Take 20 mg by mouth daily.       No current facility-administered medications for this visit.    OBJECTIVE: Filed Vitals:   05/28/12 0959  BP: 126/78  Pulse: 90  Temp: 98.3 F (36.8 C)   Resp: 20     Body mass index is 27.53 kg/(m^2).    ECOG FS:  Asymptomatic  General: Well developed, well nourished female in no acute distress. Alert and oriented x 3.  Head/ Neck: Oropharynx clear.  Sclerae anicteric.  Supple without any enlargements.  Lymph node survey: No cervical, supraclavicular, or axillary adenopathy  Cardiovascular: Regular rate and rhythm. S1 and S2 normal.  Lungs: Clear to auscultation bilaterally. No wheezes/crackles/rhonchi noted.  Skin: No rashes or lesions present. Back: No CVA tenderness.  Abdomen: Abdomen soft, non-tender and obese. Active bowel sounds in all quadrants. No evidence of a fluid wave or abdominal  masses.  Breasts: Inspection negative with no nodularity, masses, erythema, or discharge noted bilaterally.  S/P right mastectomy with reconstruction. Extremities: No bilateral cyanosis, edema, or clubbing.    LAB RESULTS: Lab Results  Component Value Date   WBC 3.9 06/04/2011   NEUTROABS 2.4 06/04/2011   HGB 12.6 06/04/2011   HCT 37.0 06/04/2011   MCV 85.1 06/04/2011   PLT 160 06/04/2011      Chemistry      Component Value Date/Time   NA 142 06/04/2011 1014   K 3.9 06/04/2011 1014   CL 109 06/04/2011 1014   CO2 23 06/04/2011 1014   BUN 21 06/04/2011 1014   CREATININE 0.72 06/04/2011 1014      Component Value Date/Time   CALCIUM 9.6 06/04/2011 1014   ALKPHOS 76 06/04/2011 1014   AST 31 06/04/2011 1014   ALT 33 06/04/2011 1014   BILITOT 1.0 06/04/2011 1014       Lab Results  Component Value Date   LABCA2 11 12/03/2010    No components found with this basename: [id number]    No results found for this basename: INR,  in the last 168 hours  Urinalysis    Component Value Date/Time   COLORURINE YELLOW 09/03/2006 1533   APPEARANCEUR CLEAR 09/03/2006 1533   LABSPEC 1.025 09/03/2006 1533   PHURINE 5.0 09/03/2006 1533   GLUCOSEU NEGATIVE 09/03/2006 1533   HGBUR NEGATIVE 09/03/2006 1533   BILIRUBINUR NEGATIVE 09/03/2006 1533   KETONESUR NEGATIVE 09/03/2006 1533   PROTEINUR  NEGATIVE 09/03/2006 1533   UROBILINOGEN 0.2 09/03/2006 1533   NITRITE NEGATIVE 09/03/2006 1533   LEUKOCYTESUR NEGATIVE MICROSCOPIC NOT DONE ON URINES WITH NEGATIVE PROTEIN, BLOOD, LEUKOCYTES, NITRITE, OR GLUCOSE <1000 mg/dL. 09/03/2006 1533    STUDIES: No results found.  ASSESSMENT: 67 y.o. [place] woman: #1  S/P left lumpectomy with sentinel lymph node biopsy on 10/18/02 by Dr. 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  #2  She underwent radiation therapy under the care of Dr. Roselind Messier from 12/01/02 to 01/19/03.  #3  She was started on Arimidex in January 2005 and took the medication for 6 months.  #4  S/P right mastectomy with sentinel lymph node biopsy on 09/04/06 by Dr. Jamey Ripa for T1b N0 (i+) IDC with DCIS of the right breast, Stage I, ER 100%, PR 14%, HER 2 3+, Ki67 26%.  #5  She completed 4 cycles of TCH, with the first cycles beginning in 10/17/06 along with Herceptin for one year with completion in 09/2007.  #6  She started on Femara in August 2010 and discontinued the medication in October 2013.  PLAN:  She is advised to follow up with Dr. Katrinka Blazing for future care including annual physician breast examination and annual mammography.  She is advised to call for any questions or concerns.  She has been discharged from the practice since she has been without evidence of recurrence or disease since 2008 and is advised to call for any future needs.  The patient was reviewed with Dr. Darnelle Catalan, who spoke with the patient about following up with Dr. Katrinka Blazing in the future for care.   Welles Walthall DEAL    05/28/2012

## 2012-06-04 ENCOUNTER — Other Ambulatory Visit: Payer: Medicare Other | Admitting: Lab

## 2012-06-11 ENCOUNTER — Ambulatory Visit: Payer: Medicare Other | Admitting: Oncology

## 2012-06-19 ENCOUNTER — Ambulatory Visit: Payer: Self-pay | Admitting: Oncology

## 2012-08-26 ENCOUNTER — Ambulatory Visit
Admission: RE | Admit: 2012-08-26 | Discharge: 2012-08-26 | Disposition: A | Discharge status: Home / Self Care | Payer: Medicare Other | Source: Ambulatory Visit | Attending: Gynecologic Oncology | Admitting: Gynecologic Oncology

## 2012-08-26 DIAGNOSIS — Z853 Personal history of malignant neoplasm of breast: Secondary | ICD-10-CM

## 2012-08-26 DIAGNOSIS — C50919 Malignant neoplasm of unspecified site of unspecified female breast: Secondary | ICD-10-CM

## 2013-08-02 ENCOUNTER — Other Ambulatory Visit: Payer: Self-pay

## 2013-08-02 DIAGNOSIS — Z9011 Acquired absence of right breast and nipple: Secondary | ICD-10-CM

## 2013-08-02 DIAGNOSIS — Z1231 Encounter for screening mammogram for malignant neoplasm of breast: Secondary | ICD-10-CM

## 2013-08-30 ENCOUNTER — Ambulatory Visit
Admission: RE | Admit: 2013-08-30 | Discharge: 2013-08-30 | Disposition: A | Discharge status: Home / Self Care | Payer: Medicare Other | Source: Ambulatory Visit

## 2013-08-30 DIAGNOSIS — Z9011 Acquired absence of right breast and nipple: Secondary | ICD-10-CM

## 2013-08-30 DIAGNOSIS — Z1231 Encounter for screening mammogram for malignant neoplasm of breast: Secondary | ICD-10-CM

## 2013-11-09 ENCOUNTER — Encounter: Payer: Self-pay | Admitting: Neurology

## 2013-11-09 ENCOUNTER — Ambulatory Visit (INDEPENDENT_AMBULATORY_CARE_PROVIDER_SITE_OTHER): Payer: Medicare Other | Admitting: Neurology

## 2013-11-09 VITALS — BP 124/76 | HR 69 | Ht 66.5 in | Wt 171.0 lb

## 2013-11-09 DIAGNOSIS — G252 Other specified forms of tremor: Principal | ICD-10-CM

## 2013-11-09 DIAGNOSIS — G250 Essential tremor: Secondary | ICD-10-CM

## 2013-11-09 MED ORDER — PRIMIDONE 50 MG PO TABS: 50.0000 mg | ORAL_TABLET | Freq: Every day | ORAL | Status: DC

## 2013-11-09 NOTE — Progress Notes (Signed)
Subjective:   Carol Levy was seen in consultation in the movement disorder clinic at the request of Reginia Naas, MD.  The evaluation is for tremor.  Pt is R hand dominant.  Pt notes tremor in both hands and she will sometimes note it in her mouth (a quiver).  Her mother has same.  Pt states that hand tremor started in her 30's.  She went to a doctor then and was given inderal but lost it before she even tried it.  Pt states that she is on atenolol for BP and has been on that for years.  Was told that she could try and increase the dose but she didn't do that.  BP/pulse already low and worries about going up on it further.  If she is carrying a plate at a buffet, her plate will shake.     Affected by caffeine:  No.  (drinks 1 cup coffee per day) Affected by alcohol:  Yes.   (3 glasses wine per week) Affected by stress:  Yes.   Affected by fatigue:  No. Spills soup if on spoon:  Yes.   (rarely gets soup because of this) Spills glass of liquid if full:  Yes.  (holds glass with both hands because of this) Affects ADL's (tying shoes, brushing teeth, etc):  No.  Some trouble sewing, threading a needle  Current/Previously tried tremor medications: atenolol  Current medications that may exacerbate tremor:  n/a  Outside reports reviewed: historical medical records and lab reports.  No Known Allergies  Outpatient Encounter Prescriptions as of 11/09/2013  Medication Sig  . atenolol (TENORMIN) 50 MG tablet Take 50 mg by mouth daily.  Marland Kitchen atorvastatin (LIPITOR) 80 MG tablet Take 80 mg by mouth daily.  . calcium-vitamin D (OSCAL WITH D) 500-200 MG-UNIT per tablet Take 2 tablets by mouth daily.  . cetirizine (ZYRTEC) 10 MG tablet Take 10 mg by mouth daily.  . cholecalciferol (VITAMIN D) 1000 UNITS tablet Take 1,000 Units by mouth daily.  Marland Kitchen ezetimibe (ZETIA) 10 MG tablet Take 10 mg by mouth daily.  Marland Kitchen lisinopril (PRINIVIL,ZESTRIL) 20 MG tablet Take 20 mg by mouth daily.    Past  Medical History  Diagnosis Date  . S/P radiation therapy     12/01/02 to 01/19/03  . History of chemotherapy     4 cycles TCH with Herceptin for one year in 2008  . Breast cancer     L breast IDC in 2004, R breast IDC with DCIS in 2008  . Hypertension   . Hypercholesterolemia   . Seasonal allergies   . Essential tremor     Past Surgical History  Procedure Laterality Date  . Breast lumpectomy  10/18/02    left breast  . Breast reconstruction Right   . Mastectomy  09/04/06  . Dilation and curettage of uterus    . Wisdom tooth extraction      History   Social History  . Marital Status: Married    Spouse Name: N/A    Number of Children: N/A  . Years of Education: N/A   Occupational History  . Not on file.   Social History Main Topics  . Smoking status: Never Smoker   . Smokeless tobacco: Not on file  . Alcohol Use: Yes     Comment: 3 glasses a week  . Drug Use: No  . Sexual Activity: Not on file   Other Topics Concern  . Not on file   Social History Narrative  .  No narrative on file    Family Status  Relation Status Death Age  . Mother Alive     breast cancer, essential tremor  . Father Deceased     melanoma  . Brother Alive     healthy  . Son Alive     healthy    Review of Systems Decreased smell and taste.  A complete 10 system ROS was obtained and was negative apart from what is mentioned.   Objective:   VITALS:   Filed Vitals:   11/09/13 1334  BP: 124/76  Pulse: 69  Height: 5' 6.5" (1.689 m)  Weight: 171 lb (77.565 kg)   Gen:  Appears stated age and in NAD. HEENT:  Normocephalic, atraumatic. The mucous membranes are moist. The superficial temporal arteries are without ropiness or tenderness. Cardiovascular: Regular rate and rhythm. Lungs: Clear to auscultation bilaterally. Neck: There are no carotid bruits noted bilaterally.  NEUROLOGICAL:  Orientation:  The patient is alert and oriented x 3.  Recent and remote memory are intact.   Attention span and concentration are normal.  Able to name objects and repeat without trouble.  Fund of knowledge is appropriate Cranial nerves: There is good facial symmetry. The pupils are equal round and reactive to light bilaterally. Fundoscopic exam reveals clear disc margins bilaterally. Extraocular muscles are intact and visual fields are full to confrontational testing. Speech is fluent and clear. Soft palate rises symmetrically and there is no tongue deviation. Hearing is intact to conversational tone. Tone: ? Slight increase tone LUE vs inability to relax. Sensation: Sensation is intact to light touch and pinprick throughout (facial, trunk, extremities). Vibration is intact at the bilateral big toe but decreased. There is no extinction with double simultaneous stimulation. There is no sensory dermatomal level identified. Coordination:  The patient has slight difficulty with RAMs in the LUE Motor: Strength is 5/5 in the bilateral upper and lower extremities.  Shoulder shrug is equal bilaterally.  There is no pronator drift.  There are no fasciculations noted. DTR's: Deep tendon reflexes are 1/4 at the bilateral biceps, triceps, brachioradialis, patella and achilles.  Plantar responses are downgoing bilaterally. Gait and Station: The patient is able to ambulate without difficulty.   MOVEMENT EXAM: Tremor:  There is tremor in the UE, noted most significantly with action.  The right is worse than the left.  She has slight difficulty with Archimedes spirals.  Minimal difficulty with pouring water from one glass to another.  Patient has had tremor in the "no" direction.  Labs: I did review labs drawn at her primary care physician and equal.  These were done on 09/07/2013.  White blood cells were 5.2, hemoglobin 13.3, hematocrit 39.7 and platelets 190.  Creatinine was 0.78, AST 23, ALT 20 and alkaline phosphatase 61.     Assessment/Plan:   1.   Tremor.  -This is likely just essential tremor.   She has had this for a very long time, since her early 47s.  In addition, her mother has the same.  We talked about various treatment options, in quitting the possibility of perhaps changing her selective beta blocker to something nonselective, but ultimately she decided to try primidone instead.  Risks, benefits, side effects and alternative therapies were discussed.  The opportunity to ask questions was given and they were answered to the best of my ability.  The patient expressed understanding and willingness to follow the outlined treatment protocols.  -I don't think that this tremor represents anything neurodegenerative but will watch  for ET/PD as she does have some trouble with RAM's on the L and some probable gegenhalten vs true increased tone on the L.   2.  Return in about 3 months (around 02/08/2014).

## 2013-11-09 NOTE — Patient Instructions (Signed)
1. Start Primidone 50 mg tablets - 1/2 tablet by mouth at night for 3 nights and then increase to 1 tablet by mouth nightly.

## 2014-02-07 ENCOUNTER — Ambulatory Visit (INDEPENDENT_AMBULATORY_CARE_PROVIDER_SITE_OTHER): Payer: Medicare Other | Admitting: Neurology

## 2014-02-07 ENCOUNTER — Encounter: Payer: Self-pay | Admitting: Neurology

## 2014-02-07 VITALS — BP 118/74 | HR 82 | Ht 66.0 in | Wt 168.0 lb

## 2014-02-07 DIAGNOSIS — R251 Tremor, unspecified: Secondary | ICD-10-CM

## 2014-02-07 DIAGNOSIS — G252 Other specified forms of tremor: Principal | ICD-10-CM

## 2014-02-07 DIAGNOSIS — G250 Essential tremor: Secondary | ICD-10-CM

## 2014-02-07 MED ORDER — TOPIRAMATE 100 MG PO TABS: 100.0000 mg | ORAL_TABLET | Freq: Every day | ORAL | Status: DC

## 2014-02-07 MED ORDER — TOPIRAMATE 25 MG PO CPSP: ORAL_CAPSULE | ORAL | Status: DC

## 2014-02-07 NOTE — Progress Notes (Signed)
Subjective:   Carol Levy was seen in consultation in the movement disorder clinic at the request of Reginia Naas, MD.  The evaluation is for tremor.  Pt is R hand dominant.  Pt notes tremor in both hands and she will sometimes note it in her mouth (a quiver).  Her mother has same.  Pt states that hand tremor started in her 30's.  She went to a doctor then and was given inderal but lost it before she even tried it.  Pt states that she is on atenolol for BP and has been on that for years.  Was told that she could try and increase the dose but she didn't do that.  BP/pulse already low and worries about going up on it further.  If she is carrying a plate at a buffet, her plate will shake.     Affected by caffeine:  No.  (drinks 1 cup coffee per day) Affected by alcohol:  Yes.   (3 glasses wine per week) Affected by stress:  Yes.   Affected by fatigue:  No. Spills soup if on spoon:  Yes.   (rarely gets soup because of this) Spills glass of liquid if full:  Yes.  (holds glass with both hands because of this) Affects ADL's (tying shoes, brushing teeth, etc):  No.  Some trouble sewing, threading a needle  02/07/14 update:  The patient returns today for follow-up.  We started the patient on primidone last visit.  She is still on atenolol, but that is primarily for her blood pressure.  Today, the patient states that the medication (primidone) makes her feel heavy headed and if she drinks alcohol, she gets "tipsy" quickly (after 2 drinks).  Therefore she doesn't want to go up on it to try and get better tremor control.    Outside reports reviewed: historical medical records and lab reports.  No Known Allergies  Outpatient Encounter Prescriptions as of 02/07/2014  Medication Sig  . atenolol (TENORMIN) 50 MG tablet Take 50 mg by mouth daily.  Marland Kitchen atorvastatin (LIPITOR) 80 MG tablet Take 80 mg by mouth daily.  . calcium-vitamin D (OSCAL WITH D) 500-200 MG-UNIT per tablet Take 2 tablets by  mouth daily.  . cetirizine (ZYRTEC) 10 MG tablet Take 10 mg by mouth daily.  . cholecalciferol (VITAMIN D) 1000 UNITS tablet Take 1,000 Units by mouth daily.  Marland Kitchen ezetimibe (ZETIA) 10 MG tablet Take 10 mg by mouth daily.  Marland Kitchen lisinopril (PRINIVIL,ZESTRIL) 20 MG tablet Take 20 mg by mouth daily.  . primidone (MYSOLINE) 50 MG tablet Take 1 tablet (50 mg total) by mouth at bedtime.  . topiramate (TOPAMAX) 100 MG tablet Take 1 tablet (100 mg total) by mouth daily.  Marland Kitchen topiramate (TOPAMAX) 25 MG capsule 1 po q day x 1 week, then 2 po q day x 1 week, then 3 po q day x 1 week    Past Medical History  Diagnosis Date  . S/P radiation therapy     12/01/02 to 01/19/03  . History of chemotherapy     4 cycles TCH with Herceptin for one year in 2008  . Breast cancer     L breast IDC in 2004, R breast IDC with DCIS in 2008  . Hypertension   . Hypercholesterolemia   . Seasonal allergies   . Essential tremor     Past Surgical History  Procedure Laterality Date  . Breast lumpectomy  10/18/02    left breast  . Breast reconstruction Right   .  Mastectomy  09/04/06  . Dilation and curettage of uterus    . Wisdom tooth extraction      History   Social History  . Marital Status: Married    Spouse Name: N/A    Number of Children: N/A  . Years of Education: N/A   Occupational History  . Not on file.   Social History Main Topics  . Smoking status: Never Smoker   . Smokeless tobacco: Not on file  . Alcohol Use: Yes     Comment: 3 glasses a week  . Drug Use: No  . Sexual Activity: Not on file   Other Topics Concern  . Not on file   Social History Narrative    Family Status  Relation Status Death Age  . Mother Alive     breast cancer, essential tremor  . Father Deceased     melanoma  . Brother Alive     healthy  . Son Alive     healthy    Review of Systems Decreased smell and taste.  A complete 10 system ROS was obtained and was negative apart from what is mentioned.     Objective:   VITALS:   Filed Vitals:   02/07/14 1246  BP: 118/74  Pulse: 82  Height: 5\' 6"  (1.676 m)  Weight: 168 lb (76.204 kg)  SpO2: 97%   Gen:  Appears stated age and in NAD. HEENT:  Normocephalic, atraumatic. The mucous membranes are moist. The superficial temporal arteries are without ropiness or tenderness.   NEUROLOGICAL:  Orientation:  The patient is alert and oriented x 3.  Recent and remote memory are intact.  Attention span and concentration are normal.  Able to name objects and repeat without trouble.  Fund of knowledge is appropriate Cranial nerves: There is good facial symmetry.  Extraocular muscles are intact and visual fields are full to confrontational testing. Speech is fluent and clear. Soft palate rises symmetrically and there is no tongue deviation. Hearing is intact to conversational tone. Tone: ? Slight increase tone LUE vs inability to relax. Sensation: Sensation is intact to light touch and pinprick throughout (facial, trunk, extremities). Vibration is intact at the bilateral big toe but decreased. There is no extinction with double simultaneous stimulation. There is no sensory dermatomal level identified. Coordination:  The patient has slight difficulty with RAMs in the LUE Motor: Strength is 5/5 in the bilateral upper and lower extremities.  Shoulder shrug is equal bilaterally.  There is no pronator drift.  There are no fasciculations noted. Gait and Station: The patient is able to ambulate without difficulty.   MOVEMENT EXAM: Tremor:  There is tremor in the UE, noted most significantly with action.  The right is worse than the left.  She has slight difficulty with Archimedes spirals.  Minimal difficulty with pouring water from one glass to another.  Patient has had tremor in the "no" direction.  Labs: I did review labs drawn at her primary care physician and equal.  These were done on 09/07/2013.  White blood cells were 5.2, hemoglobin 13.3, hematocrit 39.7  and platelets 190.  Creatinine was 0.78, AST 23, ALT 20 and alkaline phosphatase 61.     Assessment/Plan:   1.   Tremor.  -This is likely just essential tremor.  She has had this for a very long time, since her early 34s.  In addition, her mother has the same.  We talked about various treatment options, including the possibility of perhaps changing her selective beta  blocker to something nonselective since Primidone caused SE at low dosages.  She wants to try topamax instead (2nd line agent).  Risks, benefits, side effects and alternative therapies were discussed.  The opportunity to ask questions was given and they were answered to the best of my ability.  The patient expressed understanding and willingness to follow the outlined treatment protocols.  -I don't think that this tremor represents anything neurodegenerative but will watch for ET/PD as she does have some trouble with RAM's on the L and some probable gegenhalten vs true increased tone on the L.   2.  Return in about 3 months (around 05/09/2014).

## 2014-02-07 NOTE — Patient Instructions (Signed)
1.  Start topamax 25 mg as follows:  1 tablet daily for 1 week, then 2 tablets daily for 1 week and then 3 tablets daily for 1 week and then start the 100 mg, 1 tablet daily 2.  Stop the primidone

## 2014-05-09 ENCOUNTER — Ambulatory Visit (INDEPENDENT_AMBULATORY_CARE_PROVIDER_SITE_OTHER): Payer: Medicare Other | Admitting: Neurology

## 2014-05-09 ENCOUNTER — Encounter: Payer: Self-pay | Admitting: Neurology

## 2014-05-09 VITALS — BP 110/70 | HR 70 | Ht 66.0 in | Wt 165.1 lb

## 2014-05-09 DIAGNOSIS — G250 Essential tremor: Secondary | ICD-10-CM

## 2014-05-09 MED ORDER — TOPIRAMATE 50 MG PO TABS: 50.0000 mg | ORAL_TABLET | Freq: Two times a day (BID) | ORAL | Status: DC

## 2014-05-09 NOTE — Patient Instructions (Signed)
1.  Change topamax to 50 mg twice a day once you are done with your current RX

## 2014-05-09 NOTE — Progress Notes (Signed)
Subjective:   Carol Levy was seen in consultation in the movement disorder clinic at the request of Reginia Naas, MD.  The evaluation is for tremor.  Pt is R hand dominant.  Pt notes tremor in both hands and she will sometimes note it in her mouth (a quiver).  Her mother has same.  Pt states that hand tremor started in her 30's.  She went to a doctor then and was given inderal but lost it before she even tried it.  Pt states that she is on atenolol for BP and has been on that for years.  Was told that she could try and increase the dose but she didn't do that.  BP/pulse already low and worries about going up on it further.  If she is carrying a plate at a buffet, her plate will shake.     Affected by caffeine:  No.  (drinks 1 cup coffee per day) Affected by alcohol:  Yes.   (3 glasses wine per week) Affected by stress:  Yes.   Affected by fatigue:  No. Spills soup if on spoon:  Yes.   (rarely gets soup because of this) Spills glass of liquid if full:  Yes.  (holds glass with both hands because of this) Affects ADL's (tying shoes, brushing teeth, etc):  No.  Some trouble sewing, threading a needle  02/07/14 update:  The patient returns today for follow-up.  We started the patient on primidone last visit.  She is still on atenolol, but that is primarily for her blood pressure.  Today, the patient states that the medication (primidone) makes her feel heavy headed and if she drinks alcohol, she gets "tipsy" quickly (after 2 drinks).  Therefore she doesn't want to go up on it to try and get better tremor control.    05/09/14 update:  Pt is f/u today.  Started topamax last visit for tremor and is now on 100 mg daily.  She states that it has helped tremor but she is having memory issues especially word finding troubles.  She has noted that carbonated beverages taste poorly.  Has lost appetite.    Outside reports reviewed: historical medical records and lab reports.  No Known  Allergies  Outpatient Encounter Prescriptions as of 05/09/2014  Medication Sig  . atenolol (TENORMIN) 50 MG tablet Take 50 mg by mouth daily.  Marland Kitchen atorvastatin (LIPITOR) 80 MG tablet Take 80 mg by mouth daily.  . calcium-vitamin D (OSCAL WITH D) 500-200 MG-UNIT per tablet Take 2 tablets by mouth daily.  . cetirizine (ZYRTEC) 10 MG tablet Take 10 mg by mouth daily.  . cholecalciferol (VITAMIN D) 1000 UNITS tablet Take 1,000 Units by mouth daily.  Marland Kitchen ezetimibe (ZETIA) 10 MG tablet Take 10 mg by mouth daily.  Marland Kitchen lisinopril (PRINIVIL,ZESTRIL) 20 MG tablet Take 20 mg by mouth daily.  . primidone (MYSOLINE) 50 MG tablet Take 1 tablet (50 mg total) by mouth at bedtime.  . topiramate (TOPAMAX) 50 MG tablet Take 1 tablet (50 mg total) by mouth 2 (two) times daily.  . [DISCONTINUED] topiramate (TOPAMAX) 100 MG tablet Take 1 tablet (100 mg total) by mouth daily.  . [DISCONTINUED] topiramate (TOPAMAX) 25 MG capsule 1 po q day x 1 week, then 2 po q day x 1 week, then 3 po q day x 1 week    Past Medical History  Diagnosis Date  . S/P radiation therapy     12/01/02 to 01/19/03  . History of chemotherapy  4 cycles TCH with Herceptin for one year in 2008  . Breast cancer     L breast IDC in 2004, R breast IDC with DCIS in 2008  . Hypertension   . Hypercholesterolemia   . Seasonal allergies   . Essential tremor     Past Surgical History  Procedure Laterality Date  . Breast lumpectomy  10/18/02    left breast  . Breast reconstruction Right   . Mastectomy  09/04/06  . Dilation and curettage of uterus    . Wisdom tooth extraction      History   Social History  . Marital Status: Married    Spouse Name: N/A  . Number of Children: N/A  . Years of Education: N/A   Occupational History  . Not on file.   Social History Main Topics  . Smoking status: Never Smoker   . Smokeless tobacco: Not on file  . Alcohol Use: 0.0 oz/week    0 Standard drinks or equivalent per week     Comment: 3  glasses a week  . Drug Use: No  . Sexual Activity: Not on file   Other Topics Concern  . Not on file   Social History Narrative    Family Status  Relation Status Death Age  . Mother Alive     breast cancer, essential tremor  . Father Deceased     melanoma  . Brother Alive     healthy  . Son Alive     healthy    Review of Systems  A complete 10 system ROS was obtained and was negative apart from what is mentioned.   Objective:   VITALS:   Filed Vitals:   05/09/14 1251  BP: 110/70  Pulse: 70  Height: 5\' 6"  (1.676 m)  Weight: 165 lb 2 oz (74.9 kg)  SpO2: 94%   Wt Readings from Last 3 Encounters:  05/09/14 165 lb 2 oz (74.9 kg)  02/07/14 168 lb (76.204 kg)  11/09/13 171 lb (77.565 kg)     Gen:  Appears stated age and in NAD. HEENT:  Normocephalic, atraumatic. The mucous membranes are moist. The superficial temporal arteries are without ropiness or tenderness.   NEUROLOGICAL:  Orientation:  The patient is alert and oriented x 3.  Recent and remote memory are intact.  Attention span and concentration are normal.  Able to name objects and repeat without trouble.  Fund of knowledge is appropriate Cranial nerves: There is good facial symmetry.  Extraocular muscles are intact and visual fields are full to confrontational testing. Speech is fluent and clear. Soft palate rises symmetrically and there is no tongue deviation. Hearing is intact to conversational tone. Tone: ? Slight increase tone LUE vs inability to relax. Sensation: Sensation is intact to light touch and pinprick throughout (facial, trunk, extremities). Vibration is intact at the bilateral big toe but decreased. There is no extinction with double simultaneous stimulation. There is no sensory dermatomal level identified. Coordination:  The patient has slight difficulty with RAMs in the LUE Motor: Strength is 5/5 in the bilateral upper and lower extremities.  Shoulder shrug is equal bilaterally.  There is no  pronator drift.  There are no fasciculations noted. Gait and Station: The patient is able to ambulate without difficulty.   MOVEMENT EXAM: Tremor:  There is tremor in the UE, noted most significantly with action.  The right is worse than the left.  She has slight difficulty with Archimedes spirals.  Minimal difficulty with pouring water from  one glass to another.  Patient has had tremor in the "no" direction.  Labs: I did review labs drawn at her primary care physician and equal.  These were done on 09/07/2013.  White blood cells were 5.2, hemoglobin 13.3, hematocrit 39.7 and platelets 190.  Creatinine was 0.78, AST 23, ALT 20 and alkaline phosphatase 61.     Assessment/Plan:   1.   Essential Tremor  -She has had this for a very long time, since her early 19s.  In addition, her mother has the same.    -on primidone 50 q hs  - having memory change/word finding trouble with topamax so will first try to split up the dosage to 50 mg bid.  If not helpful, then will decrease the overall dosage.  -I don't think that this tremor represents anything neurodegenerative but will watch for ET/PD as she does have some trouble with RAM's on the L and some probable gegenhalten vs true increased tone on the L.   2.  Return in about 4 months (around 09/08/2014).

## 2014-09-12 ENCOUNTER — Encounter: Payer: Self-pay | Admitting: Genetic Counselor

## 2014-09-12 ENCOUNTER — Encounter: Payer: Self-pay | Admitting: Neurology

## 2014-09-12 ENCOUNTER — Ambulatory Visit (INDEPENDENT_AMBULATORY_CARE_PROVIDER_SITE_OTHER): Payer: Medicare Other | Admitting: Neurology

## 2014-09-12 VITALS — BP 128/70 | HR 74 | Resp 20 | Ht 66.0 in | Wt 152.1 lb

## 2014-09-12 DIAGNOSIS — G250 Essential tremor: Secondary | ICD-10-CM

## 2014-09-12 NOTE — Progress Notes (Signed)
Subjective:   Carol Levy was seen in consultation in the movement disorder clinic at the request of Reginia Naas, MD.  The evaluation is for tremor.  Pt is R hand dominant.  Pt notes tremor in both hands and she will sometimes note it in her mouth (a quiver).  Her mother has same.  Pt states that hand tremor started in her 30's.  She went to a doctor then and was given inderal but lost it before she even tried it.  Pt states that she is on atenolol for BP and has been on that for years.  Was told that she could try and increase the dose but she didn't do that.  BP/pulse already low and worries about going up on it further.  If she is carrying a plate at a buffet, her plate will shake.     Affected by caffeine:  No.  (drinks 1 cup coffee per day) Affected by alcohol:  Yes.   (3 glasses wine per week) Affected by stress:  Yes.   Affected by fatigue:  No. Spills soup if on spoon:  Yes.   (rarely gets soup because of this) Spills glass of liquid if full:  Yes.  (holds glass with both hands because of this) Affects ADL's (tying shoes, brushing teeth, etc):  No.  Some trouble sewing, threading a needle  02/07/14 update:  The patient returns today for follow-up.  We started the patient on primidone last visit.  She is still on atenolol, but that is primarily for her blood pressure.  Today, the patient states that the medication (primidone) makes her feel heavy headed and if she drinks alcohol, she gets "tipsy" quickly (after 2 drinks).  Therefore she doesn't want to go up on it to try and get better tremor control.    05/09/14 update:  Pt is f/u today.  Started topamax last visit for tremor and is now on 100 mg daily.  She states that it has helped tremor but she is having memory issues especially word finding troubles.  She has noted that carbonated beverages taste poorly.  Has lost appetite.    09/12/14 update:  Pt is f/u today.  Is only on topamax once a day now as she forgets the end  of the day one.  She is off of the primidone as she thinks that it caused dreams.  Had labs around 02/2014 from PCP.  Overall, thinks that tremor is about the same.  Doesn't want/need to change med  Outside reports reviewed: historical medical records and lab reports.  No Known Allergies  Outpatient Encounter Prescriptions as of 09/12/2014  Medication Sig  . topiramate (TOPAMAX) 50 MG tablet Take 1 tablet (50 mg total) by mouth 2 (two) times daily.  Marland Kitchen atenolol (TENORMIN) 50 MG tablet Take 50 mg by mouth daily.  Marland Kitchen atorvastatin (LIPITOR) 80 MG tablet Take 80 mg by mouth daily.  . calcium-vitamin D (OSCAL WITH D) 500-200 MG-UNIT per tablet Take 2 tablets by mouth daily.  . cetirizine (ZYRTEC) 10 MG tablet Take 10 mg by mouth daily.  . cholecalciferol (VITAMIN D) 1000 UNITS tablet Take 1,000 Units by mouth daily.  Marland Kitchen ezetimibe (ZETIA) 10 MG tablet Take 10 mg by mouth daily.  Marland Kitchen lisinopril (PRINIVIL,ZESTRIL) 20 MG tablet Take 20 mg by mouth daily.  . primidone (MYSOLINE) 50 MG tablet Take 1 tablet (50 mg total) by mouth at bedtime. (Patient not taking: Reported on 09/12/2014)   No facility-administered encounter medications on file  as of 09/12/2014.    Past Medical History  Diagnosis Date  . S/P radiation therapy     12/01/02 to 01/19/03  . History of chemotherapy     4 cycles TCH with Herceptin for one year in 2008  . Breast cancer     L breast IDC in 2004, R breast IDC with DCIS in 2008  . Hypertension   . Hypercholesterolemia   . Seasonal allergies   . Essential tremor     Past Surgical History  Procedure Laterality Date  . Breast lumpectomy  10/18/02    left breast  . Breast reconstruction Right   . Mastectomy  09/04/06  . Dilation and curettage of uterus    . Wisdom tooth extraction      History   Social History  . Marital Status: Married    Spouse Name: N/A  . Number of Children: N/A  . Years of Education: N/A   Occupational History  . Not on file.   Social History  Main Topics  . Smoking status: Never Smoker   . Smokeless tobacco: Never Used  . Alcohol Use: 0.0 oz/week    0 Standard drinks or equivalent per week     Comment: 3 glasses a week  . Drug Use: No  . Sexual Activity: No   Other Topics Concern  . Not on file   Social History Narrative    Family Status  Relation Status Death Age  . Mother Alive     breast cancer, essential tremor  . Father Deceased     melanoma  . Brother Alive     healthy  . Son Alive     healthy    Review of Systems  A complete 10 system ROS was obtained and was negative apart from what is mentioned.   Objective:   VITALS:   Filed Vitals:   09/12/14 1200  BP: 128/70  Pulse: 74  Resp: 20  Height: 5\' 6"  (1.676 m)  Weight: 152 lb 1.6 oz (68.992 kg)  SpO2: 97%   Wt Readings from Last 3 Encounters:  09/12/14 152 lb 1.6 oz (68.992 kg)  05/09/14 165 lb 2 oz (74.9 kg)  02/07/14 168 lb (76.204 kg)     Gen:  Appears stated age and in NAD. HEENT:  Normocephalic, atraumatic. The mucous membranes are moist. The superficial temporal arteries are without ropiness or tenderness.   NEUROLOGICAL:  Orientation:  The patient is alert and oriented x 3.  Recent and remote memory are intact.  Attention span and concentration are normal.  Able to name objects and repeat without trouble.  Fund of knowledge is appropriate Cranial nerves: There is good facial symmetry.  Extraocular muscles are intact and visual fields are full to confrontational testing. Speech is fluent and clear. Soft palate rises symmetrically and there is no tongue deviation. Hearing is intact to conversational tone. Tone: normal Sensation: Sensation is intact to light touch and pinprick throughout (facial, trunk, extremities). Vibration is intact at the bilateral big toe but decreased. There is no extinction with double simultaneous stimulation. There is no sensory dermatomal level identified. Coordination:  The patient has slight difficulty with  RAMs in the LUE, esp with alteration of supination/pronation in the forearm Motor: Strength is 5/5 in the bilateral upper and lower extremities.  Shoulder shrug is equal bilaterally.  There is no pronator drift.  There are no fasciculations noted. Gait and Station: The patient is able to ambulate without difficulty.   MOVEMENT EXAM: Tremor:  There is tremor in the UE, noted most significantly with action.  The right is worse than the left.  She has slight difficulty with Archimedes spirals.  Minimal difficulty with pouring water from one glass to another.  No head tremor today  Labs: I did review labs drawn at her primary care physician and equal.  These were done on 09/07/2013.  White blood cells were 5.2, hemoglobin 13.3, hematocrit 39.7 and platelets 190.  Creatinine was 0.78, AST 23, ALT 20 and alkaline phosphatase 61.     Assessment/Plan:   1.   Essential Tremor  -She has had this for a very long time, since her early 66s.  In addition, her mother has the same.    -took self off primidone for ? Bad dreams  -only taking once daily topamax now 50 mg daily  -I don't think that this tremor represents anything neurodegenerative but will watch for ET/PD as she does have some trouble with RAM's on the L and some probable gegenhalten vs true increased tone on the L.   2.  F/u 6 months

## 2014-12-07 ENCOUNTER — Other Ambulatory Visit: Payer: Self-pay

## 2014-12-07 DIAGNOSIS — Z1231 Encounter for screening mammogram for malignant neoplasm of breast: Secondary | ICD-10-CM

## 2015-01-04 ENCOUNTER — Other Ambulatory Visit: Payer: Self-pay

## 2015-01-04 ENCOUNTER — Ambulatory Visit
Admission: RE | Admit: 2015-01-04 | Discharge: 2015-01-04 | Disposition: A | Discharge status: Home / Self Care | Payer: Medicare Other | Source: Ambulatory Visit

## 2015-01-04 DIAGNOSIS — Z1231 Encounter for screening mammogram for malignant neoplasm of breast: Secondary | ICD-10-CM

## 2015-03-13 ENCOUNTER — Ambulatory Visit (INDEPENDENT_AMBULATORY_CARE_PROVIDER_SITE_OTHER): Payer: Medicare Other | Admitting: Neurology

## 2015-03-13 ENCOUNTER — Encounter: Payer: Self-pay | Admitting: Neurology

## 2015-03-13 VITALS — BP 104/66 | HR 73 | Ht 66.0 in | Wt 163.0 lb

## 2015-03-13 DIAGNOSIS — G250 Essential tremor: Secondary | ICD-10-CM | POA: Diagnosis not present

## 2015-03-13 NOTE — Progress Notes (Signed)
Subjective:   Carol Levy was seen in consultation in the movement disorder clinic at the request of Reginia Naas, MD.  The evaluation is for tremor.  Pt is R hand dominant.  Pt notes tremor in both hands and she will sometimes note it in her mouth (a quiver).  Her mother has same.  Pt states that hand tremor started in her 72's.  She went to a doctor then and was given inderal but lost it before she even tried it.  Pt states that she is on atenolol for BP and has been on that for years.  Was told that she could try and increase the dose but she didn't do that.  BP/pulse already low and worries about going up on it further.  If she is carrying a plate at a buffet, her plate will shake.     Affected by caffeine:  No.  (drinks 1 cup coffee per day) Affected by alcohol:  Yes.   (3 glasses wine per week) Affected by stress:  Yes.   Affected by fatigue:  No. Spills soup if on spoon:  Yes.   (rarely gets soup because of this) Spills glass of liquid if full:  Yes.  (holds glass with both hands because of this) Affects ADL's (tying shoes, brushing teeth, etc):  No.  Some trouble sewing, threading a needle  02/07/14 update:  The patient returns today for follow-up.  We started the patient on primidone last visit.  She is still on atenolol, but that is primarily for her blood pressure.  Today, the patient states that the medication (primidone) makes her feel heavy headed and if she drinks alcohol, she gets "tipsy" quickly (after 2 drinks).  Therefore she doesn't want to go up on it to try and get better tremor control.    05/09/14 update:  Pt is f/u today.  Started topamax last visit for tremor and is now on 100 mg daily.  She states that it has helped tremor but she is having memory issues especially word finding troubles.  She has noted that carbonated beverages taste poorly.  Has lost appetite.    09/12/14 update:  Pt is f/u today.  Is only on topamax once a day now as she forgets the end  of the day one.  She is off of the primidone as she thinks that it caused dreams.  Had labs around 02/2014 from PCP.  Overall, thinks that tremor is about the same.  Doesn't want/need to change med  03/13/15 update:  The patient is following up today regarding her essential tremor.  She is only on Topamax, 50 mg daily.  Tremor is "annoying but not as bad as it was."   Did contact her primary care physician since last visit and, lab work, but the labs are about 70-year-old now.  They were from 03/09/2014.  Her AST was 22, ALT 36 and alkaline phosphatase 76.  Her BUN was 20 and creatinine 0.76.   The remainder of her chemistry was normal.  States that she did have more recent labs in December.    Outside reports reviewed: historical medical records and lab reports.  No Known Allergies  Outpatient Encounter Prescriptions as of 03/13/2015  Medication Sig  . atenolol (TENORMIN) 50 MG tablet Take 50 mg by mouth daily.  Marland Kitchen atorvastatin (LIPITOR) 80 MG tablet Take 80 mg by mouth daily.  . calcium-vitamin D (OSCAL WITH D) 500-200 MG-UNIT per tablet Take 2 tablets by mouth daily.  Marland Kitchen  cetirizine (ZYRTEC) 10 MG tablet Take 10 mg by mouth daily.  . cholecalciferol (VITAMIN D) 1000 UNITS tablet Take 1,000 Units by mouth daily.  Marland Kitchen ezetimibe (ZETIA) 10 MG tablet Take 10 mg by mouth daily.  Marland Kitchen lisinopril (PRINIVIL,ZESTRIL) 20 MG tablet Take 20 mg by mouth daily.  . primidone (MYSOLINE) 50 MG tablet Take 1 tablet (50 mg total) by mouth at bedtime. (Patient not taking: Reported on 09/12/2014)  . topiramate (TOPAMAX) 50 MG tablet Take 1 tablet (50 mg total) by mouth 2 (two) times daily.   No facility-administered encounter medications on file as of 03/13/2015.    Past Medical History  Diagnosis Date  . S/P radiation therapy     12/01/02 to 01/19/03  . History of chemotherapy     4 cycles TCH with Herceptin for one year in 2008  . Breast cancer ([place])     L breast IDC in 2004, R breast IDC with DCIS in 2008  .  Hypertension   . Hypercholesterolemia   . Seasonal allergies   . Essential tremor     Past Surgical History  Procedure Laterality Date  . Breast lumpectomy  10/18/02    left breast  . Breast reconstruction Right   . Mastectomy  09/04/06  . Dilation and curettage of uterus    . Wisdom tooth extraction      Social History   Social History  . Marital Status: Married    Spouse Name: N/A  . Number of Children: N/A  . Years of Education: N/A   Occupational History  . Not on file.   Social History Main Topics  . Smoking status: Never Smoker   . Smokeless tobacco: Never Used  . Alcohol Use: 0.0 oz/week    0 Standard drinks or equivalent per week     Comment: 3 glasses a week  . Drug Use: No  . Sexual Activity: No   Other Topics Concern  . Not on file   Social History Narrative    Family Status  Relation Status Death Age  . Mother Alive     breast cancer, essential tremor  . Father Deceased     melanoma  . Brother Alive     healthy  . Son Alive     healthy    Review of Systems  A complete 10 system ROS was obtained and was negative apart from what is mentioned.   Objective:   VITALS:   Filed Vitals:   03/13/15 1111  BP: 104/66  Pulse: 73  Height: 5\' 6"  (1.676 m)  Weight: 163 lb (73.936 kg)   Wt Readings from Last 3 Encounters:  03/13/15 163 lb (73.936 kg)  09/12/14 152 lb 1.6 oz (68.992 kg)  05/09/14 165 lb 2 oz (74.9 kg)     Gen:  Appears stated age and in NAD. HEENT:  Normocephalic, atraumatic. The mucous membranes are moist. The superficial temporal arteries are without ropiness or tenderness.   CV:  RRR Lungs:  CTAB Neck:  No bruits   NEUROLOGICAL:  Orientation:  The patient is alert and oriented x 3.   Cranial nerves: There is good facial symmetry.  Extraocular muscles are intact and visual fields are full to confrontational testing. Speech is fluent and clear. Soft palate rises symmetrically and there is no tongue deviation. Hearing is  intact to conversational tone. Tone: normal Sensation: Sensation is intact to light touch and pinprick throughout (facial, trunk, extremities). Vibration is intact at the bilateral big toe but decreased.  There is no extinction with double simultaneous stimulation. There is no sensory dermatomal level identified. Coordination:  The patient has slight difficulty with RAMs in the LUE, esp with alteration of supination/pronation in the forearm Motor: Strength is 5/5 in the bilateral upper and lower extremities.  Shoulder shrug is equal bilaterally.  There is no pronator drift.  There are no fasciculations noted. Gait and Station: The patient is able to ambulate without difficulty.   MOVEMENT EXAM: Tremor:  There is tremor in the UE, noted most significantly with action.   She has slight difficulty with Archimedes spirals.  .  Head tremor is more evident today, in the no direction.     Assessment/Plan:   1.   Essential Tremor  -She has had this for a very long time, since her early 74s.  In addition, her mother has the same.    -took self off primidone for ? Bad dreams  -only taking once daily topamax now 50 mg daily.  Doesn't wish to increase.    -I don't think that this tremor represents anything neurodegenerative but will watch for ET/PD as she does have some trouble with RAM's on the L and some probable gegenhalten vs true increased tone on the L.   2.  F/u 6 months.  Will get labs from PCP

## 2015-06-29 ENCOUNTER — Other Ambulatory Visit: Payer: Self-pay | Admitting: Neurology

## 2015-06-29 MED ORDER — TOPIRAMATE 50 MG PO TABS: 50.0000 mg | ORAL_TABLET | Freq: Every day | ORAL | Status: DC

## 2015-06-29 NOTE — Telephone Encounter (Signed)
Topamax refill requested. Per last office note- patient to remain on medication. Refill approved and sent to patient's pharmacy.   

## 2015-09-11 ENCOUNTER — Ambulatory Visit (INDEPENDENT_AMBULATORY_CARE_PROVIDER_SITE_OTHER): Payer: Medicare Other | Admitting: Neurology

## 2015-09-11 ENCOUNTER — Encounter: Payer: Self-pay | Admitting: Neurology

## 2015-09-11 VITALS — BP 110/62 | HR 70 | Ht 66.0 in | Wt 158.0 lb

## 2015-09-11 DIAGNOSIS — G43009 Migraine without aura, not intractable, without status migrainosus: Secondary | ICD-10-CM | POA: Diagnosis not present

## 2015-09-11 DIAGNOSIS — G250 Essential tremor: Secondary | ICD-10-CM | POA: Diagnosis not present

## 2015-09-11 MED ORDER — TOPIRAMATE ER 100 MG PO CAP24: 1.0000 | ORAL_CAPSULE | Freq: Every day | ORAL | Status: DC

## 2015-09-11 NOTE — Progress Notes (Signed)
Subjective:   Carol Levy was seen in consultation in the movement disorder clinic at the request of Reginia Naas, MD.  The evaluation is for tremor.  Pt is R hand dominant.  Pt notes tremor in both hands and she will sometimes note it in her mouth (a quiver).  Her mother has same.  Pt states that hand tremor started in her 30's.  She went to a doctor then and was given inderal but lost it before she even tried it.  Pt states that she is on atenolol for BP and has been on that for years.  Was told that she could try and increase the dose but she didn't do that.  BP/pulse already low and worries about going up on it further.  If she is carrying a plate at a buffet, her plate will shake.     Affected by caffeine:  No.  (drinks 1 cup coffee per day) Affected by alcohol:  Yes.   (3 glasses wine per week) Affected by stress:  Yes.   Affected by fatigue:  No. Spills soup if on spoon:  Yes.   (rarely gets soup because of this) Spills glass of liquid if full:  Yes.  (holds glass with both hands because of this) Affects ADL's (tying shoes, brushing teeth, etc):  No.  Some trouble sewing, threading a needle  02/07/14 update:  The patient returns today for follow-up.  We started the patient on primidone last visit.  She is still on atenolol, but that is primarily for her blood pressure.  Today, the patient states that the medication (primidone) makes her feel heavy headed and if she drinks alcohol, she gets "tipsy" quickly (after 2 drinks).  Therefore she doesn't want to go up on it to try and get better tremor control.    05/09/14 update:  Pt is f/u today.  Started topamax last visit for tremor and is now on 100 mg daily.  She states that it has helped tremor but she is having memory issues especially word finding troubles.  She has noted that carbonated beverages taste poorly.  Has lost appetite.    09/12/14 update:  Pt is f/u today.  Is only on topamax once a day now as she forgets the end  of the day one.  She is off of the primidone as she thinks that it caused dreams.  Had labs around 02/2014 from PCP.  Overall, thinks that tremor is about the same.  Doesn't want/need to change med  03/13/15 update:  The patient is following up today regarding her essential tremor.  She is only on Topamax, 50 mg daily.  Tremor is "annoying but not as bad as it was."   Did contact her primary care physician since last visit and, lab work, but the labs are about 79-year-old now.  They were from 03/09/2014.  Her AST was 22, ALT 36 and alkaline phosphatase 76.  Her BUN was 20 and creatinine 0.76.   The remainder of her chemistry was normal.  States that she did have more recent labs in December.    09/11/15 update:  The patient follows up today for her essential tremor.  She remains on Topamax, 50 mg daily.  She feels that she is doing about the same and has not deteriorated but tremor is becoming more of a problem.  Having some migraine with hx of similar.  Been on higher dose of topamax in past with cog change.  Outside reports reviewed: historical medical  records and lab reports.  No Known Allergies  Outpatient Encounter Prescriptions as of 09/11/2015  Medication Sig  . atenolol (TENORMIN) 50 MG tablet Take 50 mg by mouth daily.  Marland Kitchen atorvastatin (LIPITOR) 80 MG tablet Take 80 mg by mouth daily.  . calcium-vitamin D (OSCAL WITH D) 500-200 MG-UNIT per tablet Take 2 tablets by mouth daily.  . cetirizine (ZYRTEC) 10 MG tablet Take 10 mg by mouth daily.  . cholecalciferol (VITAMIN D) 1000 UNITS tablet Take 1,000 Units by mouth daily.  Marland Kitchen ezetimibe (ZETIA) 10 MG tablet Take 10 mg by mouth daily.  Marland Kitchen lisinopril (PRINIVIL,ZESTRIL) 20 MG tablet Take 20 mg by mouth daily.  Marland Kitchen topiramate (TOPAMAX) 50 MG tablet Take 1 tablet (50 mg total) by mouth daily.   No facility-administered encounter medications on file as of 09/11/2015.    Past Medical History  Diagnosis Date  . S/P radiation therapy     12/01/02 to  01/19/03  . History of chemotherapy     4 cycles TCH with Herceptin for one year in 2008  . Breast cancer ([place])     L breast IDC in 2004, R breast IDC with DCIS in 2008  . Hypertension   . Hypercholesterolemia   . Seasonal allergies   . Essential tremor     Past Surgical History  Procedure Laterality Date  . Breast lumpectomy  10/18/02    left breast  . Breast reconstruction Right   . Mastectomy  09/04/06  . Dilation and curettage of uterus    . Wisdom tooth extraction      Social History   Social History  . Marital Status: Married    Spouse Name: N/A  . Number of Children: N/A  . Years of Education: N/A   Occupational History  . Not on file.   Social History Main Topics  . Smoking status: Never Smoker   . Smokeless tobacco: Never Used  . Alcohol Use: 0.0 oz/week    0 Standard drinks or equivalent per week     Comment: 3 glasses a week  . Drug Use: No  . Sexual Activity: No   Other Topics Concern  . Not on file   Social History Narrative    Family Status  Relation Status Death Age  . Mother Alive     breast cancer, essential tremor  . Father Deceased     melanoma  . Brother Alive     healthy  . Son Alive     healthy    Review of Systems  A complete 10 system ROS was obtained and was negative apart from what is mentioned.   Objective:   VITALS:   There were no vitals filed for this visit. Wt Readings from Last 3 Encounters:  03/13/15 163 lb (73.936 kg)  09/12/14 152 lb 1.6 oz (68.992 kg)  05/09/14 165 lb 2 oz (74.9 kg)     Gen:  Appears stated age and in NAD. HEENT:  Normocephalic, atraumatic. The mucous membranes are moist. The superficial temporal arteries are without ropiness or tenderness.   CV:  RRR Lungs:  CTAB Neck:  No bruits   NEUROLOGICAL:  Orientation:  The patient is alert and oriented x 3.   Cranial nerves: There is good facial symmetry.  Extraocular muscles are intact and visual fields are full to confrontational testing.  Speech is fluent and clear. Soft palate rises symmetrically and there is no tongue deviation. Hearing is intact to conversational tone. Tone: normal Sensation: Sensation is intact  to light touch Coordination:  The patient has slight difficulty with RAMs in the LUE, esp with alteration of supination/pronation in the forearm Motor: Strength is 5/5 in the bilateral upper and lower extremities.  Shoulder shrug is equal bilaterally.  There is no pronator drift.  There are no fasciculations noted. Gait and Station: The patient is able to ambulate without difficulty.   MOVEMENT EXAM: Tremor:  There is tremor in the UE, noted most significantly with action.  May be a bit worse when given something of weight to hold.    She has slight difficulty with Archimedes spirals.  .  Head tremor is more evident today, in the no direction.     Assessment/Plan:   1.   Essential Tremor and migraine  -She has had this for a very long time, since her early 20s.  In addition, her mother has the same.    -took self off primidone for ? Bad dreams  -change to trokendi and increase to 100 mg daily.  Tried this dose of topamax in past but caused cog change.    -I don't think that this tremor represents anything neurodegenerative but will watch for ET/PD as she does have some trouble with RAM's on the L and some probable gegenhalten vs true increased tone on the L.   2.  F/u 6 months.

## 2015-09-11 NOTE — Addendum Note (Signed)
Addended byAnnamaria Helling on: 09/11/2015 12:40 PM   Modules accepted: Orders

## 2016-01-29 ENCOUNTER — Other Ambulatory Visit: Payer: Self-pay | Admitting: Family Medicine

## 2016-01-29 DIAGNOSIS — Z1231 Encounter for screening mammogram for malignant neoplasm of breast: Secondary | ICD-10-CM

## 2016-03-06 ENCOUNTER — Ambulatory Visit
Admission: RE | Admit: 2016-03-06 | Discharge: 2016-03-06 | Disposition: A | Discharge status: Home / Self Care | Payer: Medicare Other | Source: Ambulatory Visit | Attending: Family Medicine | Admitting: Family Medicine

## 2016-03-06 DIAGNOSIS — Z1231 Encounter for screening mammogram for malignant neoplasm of breast: Secondary | ICD-10-CM

## 2016-05-15 ENCOUNTER — Other Ambulatory Visit: Payer: Self-pay | Admitting: Neurology

## 2016-06-18 NOTE — Progress Notes (Signed)
Subjective:   Carol Levy was seen in consultation in the movement disorder clinic at the request of Reginia Naas, MD.  The evaluation is for tremor.  Pt is R hand dominant.  Pt notes tremor in both hands and she will sometimes note it in her mouth (a quiver).  Her mother has same.  Pt states that hand tremor started in her 30's.  She went to a doctor then and was given inderal but lost it before she even tried it.  Pt states that she is on atenolol for BP and has been on that for years.  Was told that she could try and increase the dose but she didn't do that.  BP/pulse already low and worries about going up on it further.  If she is carrying a plate at a buffet, her plate will shake.     Affected by caffeine:  No.  (drinks 1 cup coffee per day) Affected by alcohol:  Yes.   (3 glasses wine per week) Affected by stress:  Yes.   Affected by fatigue:  No. Spills soup if on spoon:  Yes.   (rarely gets soup because of this) Spills glass of liquid if full:  Yes.  (holds glass with both hands because of this) Affects ADL's (tying shoes, brushing teeth, etc):  No.  Some trouble sewing, threading a needle  02/07/14 update:  The patient returns today for follow-up.  We started the patient on primidone last visit.  She is still on atenolol, but that is primarily for her blood pressure.  Today, the patient states that the medication (primidone) makes her feel heavy headed and if she drinks alcohol, she gets "tipsy" quickly (after 2 drinks).  Therefore she doesn't want to go up on it to try and get better tremor control.    05/09/14 update:  Pt is f/u today.  Started topamax last visit for tremor and is now on 100 mg daily.  She states that it has helped tremor but she is having memory issues especially word finding troubles.  She has noted that carbonated beverages taste poorly.  Has lost appetite.    09/12/14 update:  Pt is f/u today.  Is only on topamax once a day now as she forgets the end  of the day one.  She is off of the primidone as she thinks that it caused dreams.  Had labs around 02/2014 from PCP.  Overall, thinks that tremor is about the same.  Doesn't want/need to change med  03/13/15 update:  The patient is following up today regarding her essential tremor.  She is only on Topamax, 50 mg daily.  Tremor is "annoying but not as bad as it was."   Did contact her primary care physician since last visit and, lab work, but the labs are about 67-year-old now.  They were from 03/09/2014.  Her AST was 22, ALT 36 and alkaline phosphatase 76.  Her BUN was 20 and creatinine 0.76.   The remainder of her chemistry was normal.  States that she did have more recent labs in December.    09/11/15 update:  The patient follows up today for her essential tremor.  She remains on Topamax, 50 mg daily.  She feels that she is doing about the same and has not deteriorated but tremor is becoming more of a problem.  Having some migraine with hx of similar.  Been on higher dose of topamax in past with cog change.  06/19/16 update:  Pt f/u  today.  Last saw her in 08/2015.  Changed her to trokendi last visit and increased dose to 100 mg.  States that she is doing well and sometimes will forget things.  She is not sure if it is better than the topamax.  Outside reports reviewed: historical medical records and lab reports.  No Known Allergies  Outpatient Encounter Prescriptions as of 06/19/2016  Medication Sig  . atenolol (TENORMIN) 50 MG tablet Take 50 mg by mouth daily.  Marland Kitchen atorvastatin (LIPITOR) 80 MG tablet Take 80 mg by mouth daily.  . cetirizine (ZYRTEC) 10 MG tablet Take 10 mg by mouth daily.  . cholecalciferol (VITAMIN D) 1000 UNITS tablet Take 1,000 Units by mouth daily.  Marland Kitchen ezetimibe (ZETIA) 10 MG tablet Take 10 mg by mouth daily.  Marland Kitchen lisinopril (PRINIVIL,ZESTRIL) 20 MG tablet Take 20 mg by mouth daily.  Marland Kitchen TROKENDI XR 100 MG CP24 TAKE ONE CAPSULE BY MOUTH ONCE DAILY  . [DISCONTINUED] calcium-vitamin D  (OSCAL WITH D) 500-200 MG-UNIT per tablet Take 2 tablets by mouth daily.  . [DISCONTINUED] Topiramate ER (TROKENDI XR) 100 MG CP24 Take 1 capsule by mouth daily.  . [DISCONTINUED] Topiramate ER (TROKENDI XR) 100 MG CP24 Take 1 tablet by mouth daily.   No facility-administered encounter medications on file as of 06/19/2016.     Past Medical History:  Diagnosis Date  . Breast cancer ([place])    L breast IDC in 2004, R breast IDC with DCIS in 2008  . Essential tremor   . History of chemotherapy    4 cycles TCH with Herceptin for one year in 2008  . Hypercholesterolemia   . Hypertension   . S/P radiation therapy    12/01/02 to 01/19/03  . Seasonal allergies     Past Surgical History:  Procedure Laterality Date  . BREAST LUMPECTOMY  10/18/02   left breast  . BREAST RECONSTRUCTION Right   . DILATION AND CURETTAGE OF UTERUS    . MASTECTOMY  09/04/06  . WISDOM TOOTH EXTRACTION      Social History   Social History  . Marital status: Married    Spouse name: N/A  . Number of children: N/A  . Years of education: N/A   Occupational History  . Not on file.   Social History Main Topics  . Smoking status: Never Smoker  . Smokeless tobacco: Never Used  . Alcohol use 0.0 oz/week     Comment: 3 glasses a week  . Drug use: No  . Sexual activity: No   Other Topics Concern  . Not on file   Social History Narrative  . No narrative on file    Family Status  Relation Status  . Mother Alive   breast cancer, essential tremor  . Father Deceased   melanoma  . Brother Alive   healthy  . Son Alive   healthy    Review of Systems  A complete 10 system ROS was obtained and was negative apart from what is mentioned.   Objective:   VITALS:   Vitals:   06/19/16 1110  BP: 130/70  Pulse: 64  SpO2: 96%  Weight: 154 lb (69.9 kg)  Height: 5\' 6"  (1.676 m)   Wt Readings from Last 3 Encounters:  06/19/16 154 lb (69.9 kg)  09/11/15 158 lb (71.7 kg)  03/13/15 163 lb (73.9 kg)      Gen:  Appears stated age and in NAD. HEENT:  Normocephalic, atraumatic. The mucous membranes are moist. The superficial temporal arteries are  without ropiness or tenderness.   CV:  RRR Lungs:  CTAB Neck:  No bruits   NEUROLOGICAL:  Orientation:  The patient is alert and oriented x 3.   Cranial nerves: There is good facial symmetry.  Extraocular muscles are intact and visual fields are full to confrontational testing. Speech is fluent and clear. Soft palate rises symmetrically and there is no tongue deviation. Hearing is intact to conversational tone. Tone: normal Sensation: Sensation is intact to light touch Coordination:  The patient has slight difficulty with RAMs in the LUE, esp with alteration of supination/pronation in the forearm Motor: Strength is 5/5 in the bilateral upper and lower extremities.  Shoulder shrug is equal bilaterally.  There is no pronator drift.  There are no fasciculations noted. Gait and Station: The patient is able to ambulate without difficulty.   MOVEMENT EXAM: Tremor:  There is almost no tremor today.  She has slight difficulty with Archimedes spirals.  .  Head tremor is not evident.     Assessment/Plan:   1.   Essential Tremor and migraine  -She has had this for a very long time, since her early 85s.  In addition, her mother has the same.    -took self off primidone for ? Bad dreams  -continue trokendi  100 mg daily.  Tried this dose of topamax in past but caused cog change.    -I don't think that this tremor represents anything neurodegenerative but will watch for ET/PD as she does have some trouble with RAM's on the L and some probable gegenhalten vs true increased tone on the L.   2.  F/u 9 months.

## 2016-06-19 ENCOUNTER — Ambulatory Visit (INDEPENDENT_AMBULATORY_CARE_PROVIDER_SITE_OTHER): Payer: Medicare Other | Admitting: Neurology

## 2016-06-19 ENCOUNTER — Encounter: Payer: Self-pay | Admitting: Neurology

## 2016-06-19 VITALS — BP 130/70 | HR 64 | Ht 66.0 in | Wt 154.0 lb

## 2016-06-19 DIAGNOSIS — G250 Essential tremor: Secondary | ICD-10-CM

## 2016-06-19 MED ORDER — TOPIRAMATE ER 100 MG PO CAP24: 1.0000 | ORAL_CAPSULE | Freq: Every day | ORAL | 2 refills | Status: DC

## 2016-06-19 NOTE — Addendum Note (Signed)
Addended by: Alonza Bogus S on: 06/19/2016 11:55 AM   Modules accepted: Level of Service

## 2016-12-22 ENCOUNTER — Ambulatory Visit: Payer: Self-pay | Admitting: Orthopedic Surgery

## 2017-01-11 ENCOUNTER — Ambulatory Visit: Payer: Self-pay | Admitting: Orthopedic Surgery

## 2017-01-11 NOTE — H&P (View-Only) (Signed)
Carol Levy DOB: [birth year] Married / Language: Carol Levy / Race: White Female Date of admission: January 20, 2017  Chief complaint: Right knee pain History of Present Illness  The patient is a 71 year old female who comes in  for a preoperative History and Physical. The patient is scheduled for a right total knee arthroplasty to be performed by Dr. Dione Plover. Aluisio, MD at [facility] on 01-20-2017. The patient is a 71 year old female who presented for follow up of their knee. The patient is being followed for their right knee pain and osteoarthritis. They are now months out from cortisone injection. Symptoms reported include: pain, aching, instability and difficulty ambulating. The patient feels that they are doing poorly. The following medication has been used for pain control: none. The patient has not gotten any relief of their symptoms with Cortisone injections or viscosupplementation. She states that the right knee has been bothering her for a long time now. It has been going on for many years. It has been getting progressively worse over time. She has had cortisone and viscosupplement injections in the past without benefit. The knee is hurting with all activities. Generally, she does well at night and at rest but from a functional standpoint, she is unable to do things she desires. The left knee does not bother her. She is not currently having any hip pain, back pain, lower extremity weakness or paresthesia. She is ready to get the knee fixed. They have been treated conservatively in the past for the above stated problem and despite conservative measures, they continue to have progressive pain and severe functional limitations and dysfunction. They have failed non-operative management including home exercise, medications, and injections. It is felt that they would benefit from undergoing total joint replacement. Risks and benefits of the procedure have been discussed with the patient  and they elect to proceed with surgery. There are no active contraindications to surgery such as ongoing infection or rapidly progressive neurological disease.   Problem List/Past Medical  Chronic pain of right knee (M25.561)  Primary osteoarthritis of both knees (M17.0)  Anemia  Breast Cancer  Bilateral High blood pressure  Hypercholesterolemia  Skin Cancer  Essential Tremors   Allergies  No Known Drug Allergies   Family History  Cancer  Father, Mother. Cerebrovascular Accident  Mother. Congestive Heart Failure  Mother. Drug / Alcohol Addiction  child First Degree Relatives  reported Osteoarthritis  Mother. Osteoporosis  Mother.  Social History  Children  1 Current drinker  06/12/2016: Currently drinks wine less than 5 times per week Current work status  working full time Exercise  Exercises rarely Living situation  live with spouse Marital status  married No history of drug/alcohol rehab  Not under pain contract  Number of flights of stairs before winded  1 Tobacco / smoke exposure  06/12/2016: no Tobacco use  Never smoker. 06/12/2016 Post-Surgical Plans  Home With Family, Home with HHPT.  Medication History Zetia (10MG  Tablet, Oral) Active. Atenolol (50MG  Tablet, Oral) Active. Atorvastatin Calcium (80MG  Tablet, Oral) Active. Trokendi XR (100MG  Capsule ER 24HR, Oral) Active. Lisinopril (20MG  Tablet, Oral) Active. Calcium Active. Vitamin D Active.  Past Surgical History  Breast Biopsy  bilateral Breast Mass; Local Excision  bilateral Breast Reconstruction  right Dilation and Curettage of Uterus - Multiple  Mammoplasty; Reduction  left Mastectomy - Bilateral  right Tonsillectomy    Review of Systems  General Not Present- Chills, Fatigue, Fever, Memory Loss, Night Sweats, Weight Gain and Weight Loss.  Skin Not Present- Eczema, Hives, Itching, Lesions and Rash. HEENT Not Present- Dentures, Double Vision, Headache,  Hearing Loss, Tinnitus and Visual Loss. Respiratory Not Present- Allergies, Chronic Cough, Coughing up blood, Shortness of breath at rest and Shortness of breath with exertion. Cardiovascular Not Present- Chest Pain, Difficulty Breathing Lying Down, Murmur, Palpitations, Racing/skipping heartbeats and Swelling. Gastrointestinal Not Present- Abdominal Pain, Bloody Stool, Constipation, Diarrhea, Difficulty Swallowing, Heartburn, Jaundice, Loss of appetitie, Nausea and Vomiting. Female Genitourinary Not Present- Blood in Urine, Discharge, Flank Pain, Incontinence, Painful Urination, Urgency, Urinary frequency, Urinary Retention, Urinating at Night and Weak urinary stream. Musculoskeletal Present- Joint Pain. Not Present- Back Pain, Joint Swelling, Morning Stiffness, Muscle Pain, Muscle Weakness and Spasms. Neurological Not Present- Blackout spells, Difficulty with balance, Dizziness, Paralysis, Tremor and Weakness. Psychiatric Not Present- Insomnia.  Vitals Weight: 150 lb Height: 66in Weight was reported by patient. Height was reported by patient. Body Surface Area: 1.77 m Body Mass Index: 24.21 kg/m  Pulse: 72 (Regular)  BP: 112/62 (Sitting, Right Arm, Standard)   Physical Exam General Mental Status -Alert, cooperative and good historian. General Appearance-pleasant, Not in acute distress. Orientation-Oriented X3. Build & Nutrition-Well nourished and Well developed.  Head and Neck Head-normocephalic, atraumatic . Neck Global Assessment - supple, no bruit auscultated on the right, no bruit auscultated on the left.  Eye Vision-Wears corrective lenses(readers only). Pupil - Bilateral-Regular and Round. Motion - Bilateral-EOMI.  Chest and Lung Exam Auscultation Breath sounds - clear at anterior chest wall and clear at posterior chest wall. Adventitious sounds - No Adventitious sounds.  Cardiovascular Auscultation Rhythm - Regular rate and rhythm. Heart  Sounds - S1 WNL and S2 WNL. Murmurs & Other Heart Sounds - Auscultation of the heart reveals - No Murmurs.  Abdomen Palpation/Percussion Tenderness - Abdomen is non-tender to palpation. Rigidity (guarding) - Abdomen is soft. Auscultation Auscultation of the abdomen reveals - Bowel sounds normal.  Female Genitourinary Note: Not done, not pertinent to present illness   Musculoskeletal Note: Evaluation of her hips show normal range of motion, no discomfort. Left knee, no effusion. Range is 0 to 125 with no tenderness or instability. Right knee, no effusion, slight varus. Range is 5 to 125. Marked crepitus on range of motion with tenderness medial greater than lateral. No instability noted. Pulse sensation and motor are intact.  DIAGNOSTIC IMAGING Radiographs, AP and lateral of that knee show bone-on-bone arthritis, medial and patellofemoral compartments.   Assessment & Plan  Primary osteoarthritis of both knees (M17.0)  Note:Surgical Plans: Right Total Knee Replacement  Disposition: Home  PCP: Dr. Carol Ada - Patient has been seen preoperatively and felt to be stable for surgery.  Topical TXA - Breast Cancer  Anesthesia Issues: None  Patient was instructed on what medications to stop prior to surgery.  Signed electronically by Joelene Millin, III PA-C

## 2017-01-11 NOTE — H&P (Signed)
Carol Levy DOB: 06/22/1945 Married / Language: [name] / Race: White Female Date of admission: January 20, 2017  Chief complaint: Right knee pain History of Present Illness  The patient is a 71 year old female who comes in  for a preoperative History and Physical. The patient is scheduled for a right total knee arthroplasty to be performed by Dr. Dione Plover. Aluisio, MD at [facility] on 01-20-2017. The patient is a 71 year old female who presented for follow up of their knee. The patient is being followed for their right knee pain and osteoarthritis. They are now months out from cortisone injection. Symptoms reported include: pain, aching, instability and difficulty ambulating. The patient feels that they are doing poorly. The following medication has been used for pain control: none. The patient has not gotten any relief of their symptoms with Cortisone injections or viscosupplementation. She states that the right knee has been bothering her for a long time now. It has been going on for many years. It has been getting progressively worse over time. She has had cortisone and viscosupplement injections in the past without benefit. The knee is hurting with all activities. Generally, she does well at night and at rest but from a functional standpoint, she is unable to do things she desires. The left knee does not bother her. She is not currently having any hip pain, back pain, lower extremity weakness or paresthesia. She is ready to get the knee fixed. They have been treated conservatively in the past for the above stated problem and despite conservative measures, they continue to have progressive pain and severe functional limitations and dysfunction. They have failed non-operative management including home exercise, medications, and injections. It is felt that they would benefit from undergoing total joint replacement. Risks and benefits of the procedure have been discussed with the patient  and they elect to proceed with surgery. There are no active contraindications to surgery such as ongoing infection or rapidly progressive neurological disease.   Problem List/Past Medical  Chronic pain of right knee (M25.561)  Primary osteoarthritis of both knees (M17.0)  Anemia  Breast Cancer  Bilateral High blood pressure  Hypercholesterolemia  Skin Cancer  Essential Tremors   Allergies  No Known Drug Allergies   Family History  Cancer  Father, Mother. Cerebrovascular Accident  Mother. Congestive Heart Failure  Mother. Drug / Alcohol Addiction  child First Degree Relatives  reported Osteoarthritis  Mother. Osteoporosis  Mother.  Social History  Children  1 Current drinker  06/12/2016: Currently drinks wine less than 5 times per week Current work status  working full time Exercise  Exercises rarely Living situation  live with spouse Marital status  married No history of drug/alcohol rehab  Not under pain contract  Number of flights of stairs before winded  1 Tobacco / smoke exposure  06/12/2016: no Tobacco use  Never smoker. 06/12/2016 Post-Surgical Plans  Home With Family, Home with HHPT.  Medication History Zetia (10MG  Tablet, Oral) Active. Atenolol (50MG  Tablet, Oral) Active. Atorvastatin Calcium (80MG  Tablet, Oral) Active. Trokendi XR (100MG  Capsule ER 24HR, Oral) Active. Lisinopril (20MG  Tablet, Oral) Active. Calcium Active. Vitamin D Active.  Past Surgical History  Breast Biopsy  bilateral Breast Mass; Local Excision  bilateral Breast Reconstruction  right Dilation and Curettage of Uterus - Multiple  Mammoplasty; Reduction  left Mastectomy - Bilateral  right Tonsillectomy    Review of Systems  General Not Present- Chills, Fatigue, Fever, Memory Loss, Night Sweats, Weight Gain and Weight Loss.  Skin Not Present- Eczema, Hives, Itching, Lesions and Rash. HEENT Not Present- Dentures, Double Vision, Headache,  Hearing Loss, Tinnitus and Visual Loss. Respiratory Not Present- Allergies, Chronic Cough, Coughing up blood, Shortness of breath at rest and Shortness of breath with exertion. Cardiovascular Not Present- Chest Pain, Difficulty Breathing Lying Down, Murmur, Palpitations, Racing/skipping heartbeats and Swelling. Gastrointestinal Not Present- Abdominal Pain, Bloody Stool, Constipation, Diarrhea, Difficulty Swallowing, Heartburn, Jaundice, Loss of appetitie, Nausea and Vomiting. Female Genitourinary Not Present- Blood in Urine, Discharge, Flank Pain, Incontinence, Painful Urination, Urgency, Urinary frequency, Urinary Retention, Urinating at Night and Weak urinary stream. Musculoskeletal Present- Joint Pain. Not Present- Back Pain, Joint Swelling, Morning Stiffness, Muscle Pain, Muscle Weakness and Spasms. Neurological Not Present- Blackout spells, Difficulty with balance, Dizziness, Paralysis, Tremor and Weakness. Psychiatric Not Present- Insomnia.  Vitals Weight: 150 lb Height: 66in Weight was reported by patient. Height was reported by patient. Body Surface Area: 1.77 m Body Mass Index: 24.21 kg/m  Pulse: 72 (Regular)  BP: 112/62 (Sitting, Right Arm, Standard)   Physical Exam General Mental Status -Alert, cooperative and good historian. General Appearance-pleasant, Not in acute distress. Orientation-Oriented X3. Build & Nutrition-Well nourished and Well developed.  Head and Neck Head-normocephalic, atraumatic . Neck Global Assessment - supple, no bruit auscultated on the right, no bruit auscultated on the left.  Eye Vision-Wears corrective lenses(readers only). Pupil - Bilateral-Regular and Round. Motion - Bilateral-EOMI.  Chest and Lung Exam Auscultation Breath sounds - clear at anterior chest wall and clear at posterior chest wall. Adventitious sounds - No Adventitious sounds.  Cardiovascular Auscultation Rhythm - Regular rate and rhythm. Heart  Sounds - S1 WNL and S2 WNL. Murmurs & Other Heart Sounds - Auscultation of the heart reveals - No Murmurs.  Abdomen Palpation/Percussion Tenderness - Abdomen is non-tender to palpation. Rigidity (guarding) - Abdomen is soft. Auscultation Auscultation of the abdomen reveals - Bowel sounds normal.  Female Genitourinary Note: Not done, not pertinent to present illness   Musculoskeletal Note: Evaluation of her hips show normal range of motion, no discomfort. Left knee, no effusion. Range is 0 to 125 with no tenderness or instability. Right knee, no effusion, slight varus. Range is 5 to 125. Marked crepitus on range of motion with tenderness medial greater than lateral. No instability noted. Pulse sensation and motor are intact.  DIAGNOSTIC IMAGING Radiographs, AP and lateral of that knee show bone-on-bone arthritis, medial and patellofemoral compartments.   Assessment & Plan  Primary osteoarthritis of both knees (M17.0)  Note:Surgical Plans: Right Total Knee Replacement  Disposition: Home  PCP: Dr. Carol Ada - Patient has been seen preoperatively and felt to be stable for surgery.  Topical TXA - Breast Cancer  Anesthesia Issues: None  Patient was instructed on what medications to stop prior to surgery.  Signed electronically by Joelene Millin, III PA-C

## 2017-01-14 ENCOUNTER — Encounter (INDEPENDENT_AMBULATORY_CARE_PROVIDER_SITE_OTHER): Payer: Self-pay

## 2017-01-14 ENCOUNTER — Other Ambulatory Visit: Payer: Self-pay

## 2017-01-14 ENCOUNTER — Encounter (HOSPITAL_COMMUNITY)
Admission: RE | Admit: 2017-01-14 | Discharge: 2017-01-14 | Disposition: A | Discharge status: Home / Self Care | Payer: Medicare Other | Source: Ambulatory Visit | Attending: Orthopedic Surgery | Admitting: Orthopedic Surgery

## 2017-01-14 ENCOUNTER — Encounter (HOSPITAL_COMMUNITY): Payer: Self-pay

## 2017-01-14 DIAGNOSIS — M170 Bilateral primary osteoarthritis of knee: Secondary | ICD-10-CM | POA: Diagnosis not present

## 2017-01-14 DIAGNOSIS — Z01818 Encounter for other preprocedural examination: Secondary | ICD-10-CM | POA: Diagnosis present

## 2017-01-14 DIAGNOSIS — Z01812 Encounter for preprocedural laboratory examination: Secondary | ICD-10-CM | POA: Diagnosis not present

## 2017-01-14 DIAGNOSIS — Z0183 Encounter for blood typing: Secondary | ICD-10-CM | POA: Insufficient documentation

## 2017-01-14 DIAGNOSIS — E7800 Pure hypercholesterolemia, unspecified: Secondary | ICD-10-CM | POA: Diagnosis not present

## 2017-01-14 DIAGNOSIS — Z823 Family history of stroke: Secondary | ICD-10-CM | POA: Insufficient documentation

## 2017-01-14 DIAGNOSIS — Z79899 Other long term (current) drug therapy: Secondary | ICD-10-CM | POA: Diagnosis not present

## 2017-01-14 DIAGNOSIS — Z853 Personal history of malignant neoplasm of breast: Secondary | ICD-10-CM | POA: Insufficient documentation

## 2017-01-14 DIAGNOSIS — G250 Essential tremor: Secondary | ICD-10-CM | POA: Insufficient documentation

## 2017-01-14 DIAGNOSIS — Z8249 Family history of ischemic heart disease and other diseases of the circulatory system: Secondary | ICD-10-CM | POA: Diagnosis not present

## 2017-01-14 DIAGNOSIS — Z85828 Personal history of other malignant neoplasm of skin: Secondary | ICD-10-CM | POA: Diagnosis not present

## 2017-01-14 DIAGNOSIS — R9431 Abnormal electrocardiogram [ECG] [EKG]: Secondary | ICD-10-CM | POA: Insufficient documentation

## 2017-01-14 DIAGNOSIS — I10 Essential (primary) hypertension: Secondary | ICD-10-CM | POA: Insufficient documentation

## 2017-01-14 HISTORY — DX: Anemia, unspecified: D64.9

## 2017-01-14 HISTORY — DX: Unspecified osteoarthritis, unspecified site: M19.90

## 2017-01-14 HISTORY — DX: Family history of other specified conditions: Z84.89

## 2017-01-14 LAB — COMPREHENSIVE METABOLIC PANEL
ALK PHOS: 68 U/L (ref 38–126)
ALT: 31 U/L (ref 14–54)
AST: 32 U/L (ref 15–41)
Albumin: 4 g/dL (ref 3.5–5.0)
Anion gap: 6 (ref 5–15)
BILIRUBIN TOTAL: 0.8 mg/dL (ref 0.3–1.2)
BUN: 25 mg/dL — AB (ref 6–20)
CALCIUM: 10.1 mg/dL (ref 8.9–10.3)
CO2: 26 mmol/L (ref 22–32)
CREATININE: 0.76 mg/dL (ref 0.44–1.00)
Chloride: 109 mmol/L (ref 101–111)
GFR calc Af Amer: >60 mL/min (ref >60)
Glucose, Bld: 106 mg/dL — ABNORMAL HIGH (ref 65–99)
Potassium: 3.8 mmol/L (ref 3.5–5.1)
Sodium: 141 mmol/L (ref 135–145)
TOTAL PROTEIN: 6.8 g/dL (ref 6.5–8.1)

## 2017-01-14 LAB — CBC
HEMATOCRIT: 37.2 % (ref 36.0–46.0)
Hemoglobin: 12.3 g/dL (ref 12.0–15.0)
MCH: 29.7 pg (ref 26.0–34.0)
MCHC: 33.1 g/dL (ref 30.0–36.0)
MCV: 89.9 fL (ref 78.0–100.0)
Platelets: 217 10*3/uL (ref 150–400)
RBC: 4.14 MIL/uL (ref 3.87–5.11)
RDW: 13.1 % (ref 11.5–15.5)
WBC: 5.8 10*3/uL (ref 4.0–10.5)

## 2017-01-14 LAB — SURGICAL PCR SCREEN
MRSA, PCR: NEGATIVE
STAPHYLOCOCCUS AUREUS: POSITIVE — AB

## 2017-01-14 LAB — PROTIME-INR
INR: 0.9
PROTHROMBIN TIME: 12.1 s (ref 11.4–15.2)

## 2017-01-14 LAB — APTT: aPTT: 29 seconds (ref 24–36)

## 2017-01-14 LAB — ABO/RH: ABO/RH(D): A POS

## 2017-01-14 NOTE — Progress Notes (Signed)
CMP done 01/14/17 faxed via epic to dr Wynelle Link.

## 2017-01-14 NOTE — Patient Instructions (Addendum)
SHALETA RUACHO  01/14/2017   Your procedure is scheduled on: 01/20/2017    Report to [facility] Main  Entrance   Follow signs to Short Stay on first floor at   0515 AM  Call this number if you have problems the morning of surgery  [phone]   Remember: ONLY 1 PERSON MAY GO WITH YOU TO SHORT STAY TO GET  READY MORNING OF [place].  Do not eat food or drink liquids :After Midnight.     Take these medicines the morning of surgery with A SIP OF WATER: Atenolol, Zyrtec                                 You may not have any metal on your body including hair pins and              piercings  Do not wear jewelry, make-up, lotions, powders or perfumes, deodorant             Do not wear nail polish.  Do not shave  48 hours prior to surgery.     Do not bring valuables to the hospital. [place].  Contacts, dentures or bridgework may not be worn into surgery.  Leave suitcase in the car. After surgery it may be brought to your room.                       Please read over the following fact sheets you were given: _____________________________________________________________________             [facility] - Preparing for Surgery Before surgery, you can play an important role.  Because skin is not sterile, your skin needs to be as free of germs as possible.  You can reduce the number of germs on your skin by washing with CHG (chlorahexidine gluconate) soap before surgery.  CHG is an antiseptic cleaner which kills germs and bonds with the skin to continue killing germs even after washing. Please DO NOT use if you have an allergy to CHG or antibacterial soaps.  If your skin becomes reddened/irritated stop using the CHG and inform your nurse when you arrive at Short Stay. Do not shave (including legs and underarms) for at least 48 hours prior to the first CHG shower.  You may shave your face/neck. Please follow  these instructions carefully:  1.  Shower with CHG Soap the night before surgery and the  morning of Surgery.  2.  If you choose to wash your hair, wash your hair first as usual with your  normal  shampoo.  3.  After you shampoo, rinse your hair and body thoroughly to remove the  shampoo.                           4.  Use CHG as you would any other liquid soap.  You can apply chg directly  to the skin and wash                       Gently with a scrungie or clean washcloth.  5.  Apply the CHG Soap to your body ONLY FROM THE  NECK DOWN.   Do not use on face/ open                           Wound or open sores. Avoid contact with eyes, ears mouth and genitals (private parts).                       Wash face,  Genitals (private parts) with your normal soap.             6.  Wash thoroughly, paying special attention to the area where your surgery  will be performed.  7.  Thoroughly rinse your body with warm water from the neck down.  8.  DO NOT shower/wash with your normal soap after using and rinsing off  the CHG Soap.                9.  Pat yourself dry with a clean towel.            10.  Wear clean pajamas.            11.  Place clean sheets on your bed the night of your first shower and do not  sleep with pets. Day of Surgery : Do not apply any lotions/deodorants the morning of surgery.  Please wear clean clothes to the hospital/surgery center.  FAILURE TO FOLLOW THESE INSTRUCTIONS MAY RESULT IN THE CANCELLATION OF YOUR SURGERY PATIENT SIGNATURE_________________________________  NURSE SIGNATURE__________________________________  ________________________________________________________________________  WHAT IS A BLOOD TRANSFUSION? Blood Transfusion Information  A transfusion is the replacement of blood or some of its parts. Blood is made up of multiple cells which provide different functions.  Red blood cells carry oxygen and are used for blood loss replacement.  White blood cells fight  against infection.  Platelets control bleeding.  Plasma helps clot blood.  Other blood products are available for specialized needs, such as hemophilia or other clotting disorders. BEFORE THE TRANSFUSION  Who gives blood for transfusions?   Healthy volunteers who are fully evaluated to make sure their blood is safe. This is blood bank blood. Transfusion therapy is the safest it has ever been in the practice of medicine. Before blood is taken from a donor, a complete history is taken to make sure that person has no history of diseases nor engages in risky social behavior (examples are intravenous drug use or sexual activity with multiple partners). The donor's travel history is screened to minimize risk of transmitting infections, such as malaria. The donated blood is tested for signs of infectious diseases, such as HIV and hepatitis. The blood is then tested to be sure it is compatible with you in order to minimize the chance of a transfusion reaction. If you or a relative donates blood, this is often done in anticipation of surgery and is not appropriate for emergency situations. It takes many days to process the donated blood. RISKS AND COMPLICATIONS Although transfusion therapy is very safe and saves many lives, the main dangers of transfusion include:   Getting an infectious disease.  Developing a transfusion reaction. This is an allergic reaction to something in the blood you were given. Every precaution is taken to prevent this. The decision to have a blood transfusion has been considered carefully by your caregiver before blood is given. Blood is not given unless the benefits outweigh the risks. AFTER THE TRANSFUSION  Right after receiving a blood transfusion, you will usually feel much better and more  energetic. This is especially true if your red blood cells have gotten low (anemic). The transfusion raises the level of the red blood cells which carry oxygen, and this usually causes an  energy increase.  The nurse administering the transfusion will monitor you carefully for complications. HOME CARE INSTRUCTIONS  No special instructions are needed after a transfusion. You may find your energy is better. Speak with your caregiver about any limitations on activity for underlying diseases you may have. SEEK MEDICAL CARE IF:   Your condition is not improving after your transfusion.  You develop redness or irritation at the intravenous (IV) site. SEEK IMMEDIATE MEDICAL CARE IF:  Any of the following symptoms occur over the next 12 hours:  Shaking chills.  You have a temperature by mouth above 102 F (38.9 C), not controlled by medicine.  Chest, back, or muscle pain.  People around you feel you are not acting correctly or are confused.  Shortness of breath or difficulty breathing.  Dizziness and fainting.  You get a rash or develop hives.  You have a decrease in urine output.  Your urine turns a dark color or changes to pink, red, or brown. Any of the following symptoms occur over the next 10 days:  You have a temperature by mouth above 102 F (38.9 C), not controlled by medicine.  Shortness of breath.  Weakness after normal activity.  The white part of the eye turns yellow (jaundice).  You have a decrease in the amount of urine or are urinating less often.  Your urine turns a dark color or changes to pink, red, or brown. Document Released: 02/09/2000 Document Revised: 05/06/2011 Document Reviewed: 09/28/2007 ExitCare Patient Information 2014 [place].  _______________________________________________________________________  Incentive Spirometer  An incentive spirometer is a tool that can help keep your lungs clear and active. This tool measures how well you are filling your lungs with each breath. Taking long deep breaths may help reverse or decrease the chance of developing breathing (pulmonary) problems (especially infection) following:  A  long period of time when you are unable to move or be active. BEFORE THE PROCEDURE   If the spirometer includes an indicator to show your best effort, your nurse or respiratory therapist will set it to a desired goal.  If possible, sit up straight or lean slightly forward. Try not to slouch.  Hold the incentive spirometer in an upright position. INSTRUCTIONS FOR USE  1. Sit on the edge of your bed if possible, or sit up as far as you can in bed or on a chair. 2. Hold the incentive spirometer in an upright position. 3. Breathe out normally. 4. Place the mouthpiece in your mouth and seal your lips tightly around it. 5. Breathe in slowly and as deeply as possible, raising the piston or the ball toward the top of the column. 6. Hold your breath for 3-5 seconds or for as long as possible. Allow the piston or ball to fall to the bottom of the column. 7. Remove the mouthpiece from your mouth and breathe out normally. 8. Rest for a few seconds and repeat Steps 1 through 7 at least 10 times every 1-2 hours when you are awake. Take your time and take a few normal breaths between deep breaths. 9. The spirometer may include an indicator to show your best effort. Use the indicator as a goal to work toward during each repetition. 10. After each set of 10 deep breaths, practice coughing to be sure your lungs are  clear. If you have an incision (the cut made at the time of surgery), support your incision when coughing by placing a pillow or rolled up towels firmly against it. Once you are able to get out of bed, walk around indoors and cough well. You may stop using the incentive spirometer when instructed by your caregiver.  RISKS AND COMPLICATIONS  Take your time so you do not get dizzy or light-headed.  If you are in pain, you may need to take or ask for pain medication before doing incentive spirometry. It is harder to take a deep breath if you are having pain. AFTER USE  Rest and breathe slowly and  easily.  It can be helpful to keep track of a log of your progress. Your caregiver can provide you with a simple table to help with this. If you are using the spirometer at home, follow these instructions: [place] IF:   You are having difficultly using the spirometer.  You have trouble using the spirometer as often as instructed.  Your pain medication is not giving enough relief while using the spirometer.  You develop fever of 100.5 F (38.1 C) or higher. SEEK IMMEDIATE MEDICAL CARE IF:   You cough up bloody sputum that had not been present before.  You develop fever of 102 F (38.9 C) or greater.  You develop worsening pain at or near the incision site. MAKE SURE YOU:   Understand these instructions.  Will watch your condition.  Will get help right away if you are not doing well or get worse. Document Released: 06/24/2006 Document Revised: 05/06/2011 Document Reviewed: 08/25/2006 [facility] Patient Information 2014 [place], [place].   ________________________________________________________________________

## 2017-01-20 ENCOUNTER — Other Ambulatory Visit: Payer: Self-pay

## 2017-01-20 ENCOUNTER — Encounter (HOSPITAL_COMMUNITY)
Admission: RE | Disposition: A | Discharge status: Home / Self Care | Payer: Self-pay | Source: Ambulatory Visit | Attending: Orthopedic Surgery

## 2017-01-20 ENCOUNTER — Inpatient Hospital Stay (HOSPITAL_COMMUNITY): Payer: Medicare Other | Admitting: Certified Registered Nurse Anesthetist

## 2017-01-20 ENCOUNTER — Encounter (HOSPITAL_COMMUNITY): Payer: Self-pay

## 2017-01-20 ENCOUNTER — Inpatient Hospital Stay (HOSPITAL_COMMUNITY)
Admission: RE | Admit: 2017-01-20 | Discharge: 2017-01-22 | DRG: 470 | Disposition: A | Discharge status: Home / Self Care | Payer: Medicare Other | Source: Ambulatory Visit | Attending: Orthopedic Surgery | Admitting: Orthopedic Surgery

## 2017-01-20 DIAGNOSIS — Z9221 Personal history of antineoplastic chemotherapy: Secondary | ICD-10-CM | POA: Diagnosis not present

## 2017-01-20 DIAGNOSIS — M1711 Unilateral primary osteoarthritis, right knee: Principal | ICD-10-CM | POA: Diagnosis present

## 2017-01-20 DIAGNOSIS — Z79899 Other long term (current) drug therapy: Secondary | ICD-10-CM

## 2017-01-20 DIAGNOSIS — G250 Essential tremor: Secondary | ICD-10-CM | POA: Diagnosis present

## 2017-01-20 DIAGNOSIS — I10 Essential (primary) hypertension: Secondary | ICD-10-CM | POA: Diagnosis present

## 2017-01-20 DIAGNOSIS — D649 Anemia, unspecified: Secondary | ICD-10-CM | POA: Diagnosis present

## 2017-01-20 DIAGNOSIS — E7800 Pure hypercholesterolemia, unspecified: Secondary | ICD-10-CM | POA: Diagnosis present

## 2017-01-20 DIAGNOSIS — Z923 Personal history of irradiation: Secondary | ICD-10-CM

## 2017-01-20 DIAGNOSIS — Z853 Personal history of malignant neoplasm of breast: Secondary | ICD-10-CM | POA: Diagnosis not present

## 2017-01-20 DIAGNOSIS — M179 Osteoarthritis of knee, unspecified: Secondary | ICD-10-CM

## 2017-01-20 DIAGNOSIS — M1710 Unilateral primary osteoarthritis, unspecified knee: Secondary | ICD-10-CM | POA: Diagnosis present

## 2017-01-20 HISTORY — PX: TOTAL KNEE ARTHROPLASTY: SHX125

## 2017-01-20 LAB — TYPE AND SCREEN
ABO/RH(D): A POS
Antibody Screen: NEGATIVE

## 2017-01-20 SURGERY — ARTHROPLASTY, KNEE, TOTAL
Anesthesia: Spinal | Site: Knee | Laterality: Right

## 2017-01-20 MED ORDER — PHENYLEPHRINE HCL 10 MG/ML IJ SOLN
INTRAMUSCULAR | Status: AC
  Filled 2017-01-20: qty 1

## 2017-01-20 MED ORDER — ONDANSETRON HCL 4 MG PO TABS
4.0000 mg | ORAL_TABLET | Freq: Four times a day (QID) | ORAL | Status: DC | PRN
  Administered 2017-01-22: 4 mg via ORAL
  Filled 2017-01-20: qty 1

## 2017-01-20 MED ORDER — METOCLOPRAMIDE HCL 5 MG/ML IJ SOLN: 5.0000 mg | Freq: Three times a day (TID) | INTRAMUSCULAR | Status: DC | PRN

## 2017-01-20 MED ORDER — SODIUM CHLORIDE 0.9 % IJ SOLN
INTRAMUSCULAR | Status: AC
  Filled 2017-01-20: qty 10

## 2017-01-20 MED ORDER — SODIUM CHLORIDE 0.9 % IV BOLUS (SEPSIS)
500.0000 mL | Freq: Once | INTRAVENOUS | Status: AC
  Administered 2017-01-20: 500 mL via INTRAVENOUS

## 2017-01-20 MED ORDER — PROPOFOL 10 MG/ML IV BOLUS
INTRAVENOUS | Status: DC | PRN
  Administered 2017-01-20: 20 mg via INTRAVENOUS

## 2017-01-20 MED ORDER — ATORVASTATIN CALCIUM 40 MG PO TABS
80.0000 mg | ORAL_TABLET | Freq: Every day | ORAL | Status: DC
  Administered 2017-01-21 – 2017-01-22 (×2): 80 mg via ORAL
  Filled 2017-01-20 (×2): qty 2

## 2017-01-20 MED ORDER — TRAMADOL HCL 50 MG PO TABS: 50.0000 mg | ORAL_TABLET | Freq: Four times a day (QID) | ORAL | Status: DC | PRN

## 2017-01-20 MED ORDER — CHLORHEXIDINE GLUCONATE 4 % EX LIQD: 60.0000 mL | Freq: Once | CUTANEOUS | Status: DC

## 2017-01-20 MED ORDER — DEXAMETHASONE SODIUM PHOSPHATE 10 MG/ML IJ SOLN
10.0000 mg | Freq: Once | INTRAMUSCULAR | Status: AC
  Administered 2017-01-20: 10 mg via INTRAVENOUS

## 2017-01-20 MED ORDER — STERILE WATER FOR IRRIGATION IR SOLN
Status: DC | PRN
  Administered 2017-01-20: 2000 mL

## 2017-01-20 MED ORDER — FENTANYL CITRATE (PF) 100 MCG/2ML IJ SOLN
INTRAMUSCULAR | Status: DC | PRN
  Administered 2017-01-20 (×2): 25 ug via INTRAVENOUS
  Administered 2017-01-20: 50 ug via INTRAVENOUS

## 2017-01-20 MED ORDER — TOPIRAMATE ER 100 MG PO CAP24: 1.0000 | ORAL_CAPSULE | Freq: Every day | ORAL | Status: DC

## 2017-01-20 MED ORDER — PROPOFOL 10 MG/ML IV BOLUS
INTRAVENOUS | Status: AC
  Filled 2017-01-20: qty 60

## 2017-01-20 MED ORDER — CEFAZOLIN SODIUM-DEXTROSE 2-4 GM/100ML-% IV SOLN
INTRAVENOUS | Status: AC
  Filled 2017-01-20: qty 100

## 2017-01-20 MED ORDER — PHENYLEPHRINE HCL 10 MG/ML IJ SOLN
INTRAVENOUS | Status: DC | PRN
  Administered 2017-01-20: 50 ug/min via INTRAVENOUS

## 2017-01-20 MED ORDER — METHOCARBAMOL 1000 MG/10ML IJ SOLN
500.0000 mg | Freq: Four times a day (QID) | INTRAVENOUS | Status: DC | PRN
  Administered 2017-01-20: 500 mg via INTRAVENOUS
  Filled 2017-01-20: qty 550

## 2017-01-20 MED ORDER — SODIUM CHLORIDE 0.9 % IJ SOLN
INTRAMUSCULAR | Status: DC | PRN
  Administered 2017-01-20 (×2): 30 mL

## 2017-01-20 MED ORDER — PHENOL 1.4 % MT LIQD: 1.0000 | OROMUCOSAL | Status: DC | PRN

## 2017-01-20 MED ORDER — FLEET ENEMA 7-19 GM/118ML RE ENEM: 1.0000 | ENEMA | Freq: Once | RECTAL | Status: DC | PRN

## 2017-01-20 MED ORDER — BISACODYL 10 MG RE SUPP: 10.0000 mg | Freq: Every day | RECTAL | Status: DC | PRN

## 2017-01-20 MED ORDER — GABAPENTIN 300 MG PO CAPS
ORAL_CAPSULE | ORAL | Status: AC
  Filled 2017-01-20: qty 1

## 2017-01-20 MED ORDER — ONDANSETRON HCL 4 MG/2ML IJ SOLN: 4.0000 mg | Freq: Four times a day (QID) | INTRAMUSCULAR | Status: DC | PRN

## 2017-01-20 MED ORDER — GABAPENTIN 300 MG PO CAPS
300.0000 mg | ORAL_CAPSULE | Freq: Once | ORAL | Status: AC
  Administered 2017-01-20: 300 mg via ORAL

## 2017-01-20 MED ORDER — MIDAZOLAM HCL 5 MG/5ML IJ SOLN
INTRAMUSCULAR | Status: DC | PRN
  Administered 2017-01-20 (×2): 1 mg via INTRAVENOUS

## 2017-01-20 MED ORDER — LACTATED RINGERS IV SOLN
INTRAVENOUS | Status: DC
  Administered 2017-01-20: 1000 mL via INTRAVENOUS
  Administered 2017-01-20: 08:00:00 via INTRAVENOUS

## 2017-01-20 MED ORDER — ATENOLOL 50 MG PO TABS
50.0000 mg | ORAL_TABLET | Freq: Every day | ORAL | Status: DC
  Administered 2017-01-22: 50 mg via ORAL
  Filled 2017-01-20 (×2): qty 1

## 2017-01-20 MED ORDER — HYDROMORPHONE HCL 1 MG/ML IJ SOLN: 0.2500 mg | INTRAMUSCULAR | Status: DC | PRN

## 2017-01-20 MED ORDER — PHENYLEPHRINE 40 MCG/ML (10ML) SYRINGE FOR IV PUSH (FOR BLOOD PRESSURE SUPPORT)
PREFILLED_SYRINGE | INTRAVENOUS | Status: AC
  Filled 2017-01-20: qty 10

## 2017-01-20 MED ORDER — SODIUM CHLORIDE 0.9 % IJ SOLN
INTRAMUSCULAR | Status: AC
  Filled 2017-01-20: qty 50

## 2017-01-20 MED ORDER — SODIUM CHLORIDE 0.9 % IV BOLUS (SEPSIS): 250.0000 mL | Freq: Once | INTRAVENOUS | Status: DC

## 2017-01-20 MED ORDER — ONDANSETRON HCL 4 MG/2ML IJ SOLN
INTRAMUSCULAR | Status: AC
  Filled 2017-01-20: qty 2

## 2017-01-20 MED ORDER — RIVAROXABAN 10 MG PO TABS
10.0000 mg | ORAL_TABLET | Freq: Every day | ORAL | Status: DC
  Administered 2017-01-21 – 2017-01-22 (×2): 10 mg via ORAL
  Filled 2017-01-20 (×2): qty 1

## 2017-01-20 MED ORDER — DOCUSATE SODIUM 100 MG PO CAPS
100.0000 mg | ORAL_CAPSULE | Freq: Two times a day (BID) | ORAL | Status: DC
  Administered 2017-01-20 – 2017-01-22 (×4): 100 mg via ORAL
  Filled 2017-01-20 (×4): qty 1

## 2017-01-20 MED ORDER — LORATADINE 10 MG PO TABS
10.0000 mg | ORAL_TABLET | Freq: Every day | ORAL | Status: DC
  Administered 2017-01-21 – 2017-01-22 (×2): 10 mg via ORAL
  Filled 2017-01-20 (×2): qty 1

## 2017-01-20 MED ORDER — FENTANYL CITRATE (PF) 100 MCG/2ML IJ SOLN
INTRAMUSCULAR | Status: AC
  Filled 2017-01-20: qty 2

## 2017-01-20 MED ORDER — ACETAMINOPHEN 500 MG PO TABS
1000.0000 mg | ORAL_TABLET | Freq: Four times a day (QID) | ORAL | Status: AC
  Administered 2017-01-20 – 2017-01-21 (×4): 1000 mg via ORAL
  Filled 2017-01-20 (×4): qty 2

## 2017-01-20 MED ORDER — OXYCODONE HCL 5 MG PO TABS: 5.0000 mg | ORAL_TABLET | Freq: Once | ORAL | Status: DC | PRN

## 2017-01-20 MED ORDER — METHOCARBAMOL 500 MG PO TABS
500.0000 mg | ORAL_TABLET | Freq: Four times a day (QID) | ORAL | Status: DC | PRN
  Administered 2017-01-21 – 2017-01-22 (×3): 500 mg via ORAL
  Filled 2017-01-20 (×3): qty 1

## 2017-01-20 MED ORDER — SODIUM CHLORIDE 0.9 % IV SOLN
INTRAVENOUS | Status: DC
  Administered 2017-01-20: 75 mL/h via INTRAVENOUS

## 2017-01-20 MED ORDER — OXYCODONE HCL 5 MG PO TABS
10.0000 mg | ORAL_TABLET | ORAL | Status: DC | PRN
  Administered 2017-01-20: 5 mg via ORAL
  Administered 2017-01-22 (×2): 10 mg via ORAL
  Filled 2017-01-20 (×3): qty 2

## 2017-01-20 MED ORDER — ACETAMINOPHEN 650 MG RE SUPP
650.0000 mg | RECTAL | Status: DC | PRN
Start: 2017-01-21 — End: 2017-01-22

## 2017-01-20 MED ORDER — TRANEXAMIC ACID 1000 MG/10ML IV SOLN
2000.0000 mg | Freq: Once | INTRAVENOUS | Status: DC
  Filled 2017-01-20: qty 20

## 2017-01-20 MED ORDER — ONDANSETRON HCL 4 MG/2ML IJ SOLN
INTRAMUSCULAR | Status: DC | PRN
  Administered 2017-01-20: 4 mg via INTRAVENOUS

## 2017-01-20 MED ORDER — SODIUM CHLORIDE 0.9 % IR SOLN
Status: DC | PRN
  Administered 2017-01-20: 1000 mL

## 2017-01-20 MED ORDER — CEFAZOLIN SODIUM-DEXTROSE 1-4 GM/50ML-% IV SOLN
1.0000 g | Freq: Four times a day (QID) | INTRAVENOUS | Status: AC
  Administered 2017-01-20 (×2): 1 g via INTRAVENOUS
  Filled 2017-01-20 (×2): qty 50

## 2017-01-20 MED ORDER — MIDAZOLAM HCL 2 MG/2ML IJ SOLN
INTRAMUSCULAR | Status: AC
  Filled 2017-01-20: qty 2

## 2017-01-20 MED ORDER — DIPHENHYDRAMINE HCL 12.5 MG/5ML PO ELIX: 12.5000 mg | ORAL_SOLUTION | ORAL | Status: DC | PRN

## 2017-01-20 MED ORDER — BUPIVACAINE LIPOSOME 1.3 % IJ SUSP
INTRAMUSCULAR | Status: DC | PRN
  Administered 2017-01-20 (×2): 10 mL

## 2017-01-20 MED ORDER — DEXAMETHASONE SODIUM PHOSPHATE 10 MG/ML IJ SOLN
INTRAMUSCULAR | Status: AC
  Filled 2017-01-20: qty 1

## 2017-01-20 MED ORDER — PROPOFOL 500 MG/50ML IV EMUL
INTRAVENOUS | Status: DC | PRN
  Administered 2017-01-20: 100 ug/kg/min via INTRAVENOUS

## 2017-01-20 MED ORDER — ACETAMINOPHEN 325 MG PO TABS: 650.0000 mg | ORAL_TABLET | ORAL | Status: DC | PRN

## 2017-01-20 MED ORDER — BUPIVACAINE IN DEXTROSE 0.75-8.25 % IT SOLN
INTRATHECAL | Status: DC | PRN
  Administered 2017-01-20: 1.8 mL via INTRATHECAL

## 2017-01-20 MED ORDER — EZETIMIBE 10 MG PO TABS
10.0000 mg | ORAL_TABLET | Freq: Every day | ORAL | Status: DC
  Administered 2017-01-21 – 2017-01-22 (×2): 10 mg via ORAL
  Filled 2017-01-20 (×2): qty 1

## 2017-01-20 MED ORDER — TRANEXAMIC ACID 1000 MG/10ML IV SOLN
INTRAVENOUS | Status: AC | PRN
  Administered 2017-01-20: 2000 mg via TOPICAL

## 2017-01-20 MED ORDER — ACETAMINOPHEN 10 MG/ML IV SOLN
1000.0000 mg | Freq: Once | INTRAVENOUS | Status: AC
  Administered 2017-01-20: 1000 mg via INTRAVENOUS

## 2017-01-20 MED ORDER — OXYCODONE HCL 5 MG PO TABS
5.0000 mg | ORAL_TABLET | ORAL | Status: DC | PRN
  Administered 2017-01-20 – 2017-01-22 (×11): 5 mg via ORAL
  Filled 2017-01-20 (×10): qty 1

## 2017-01-20 MED ORDER — POLYETHYLENE GLYCOL 3350 17 G PO PACK: 17.0000 g | PACK | Freq: Every day | ORAL | Status: DC | PRN

## 2017-01-20 MED ORDER — ROPIVACAINE HCL 7.5 MG/ML IJ SOLN
INTRAMUSCULAR | Status: DC | PRN
  Administered 2017-01-20: 20 mL via PERINEURAL

## 2017-01-20 MED ORDER — 0.9 % SODIUM CHLORIDE (POUR BTL) OPTIME
TOPICAL | Status: DC | PRN
  Administered 2017-01-20: 1000 mL

## 2017-01-20 MED ORDER — ACETAMINOPHEN 10 MG/ML IV SOLN
INTRAVENOUS | Status: AC
  Filled 2017-01-20: qty 100

## 2017-01-20 MED ORDER — CEFAZOLIN SODIUM-DEXTROSE 2-4 GM/100ML-% IV SOLN
2.0000 g | INTRAVENOUS | Status: AC
  Administered 2017-01-20: 2 g via INTRAVENOUS

## 2017-01-20 MED ORDER — MENTHOL 3 MG MT LOZG: 1.0000 | LOZENGE | OROMUCOSAL | Status: DC | PRN

## 2017-01-20 MED ORDER — METOCLOPRAMIDE HCL 5 MG PO TABS: 5.0000 mg | ORAL_TABLET | Freq: Three times a day (TID) | ORAL | Status: DC | PRN

## 2017-01-20 MED ORDER — PHENYLEPHRINE 40 MCG/ML (10ML) SYRINGE FOR IV PUSH (FOR BLOOD PRESSURE SUPPORT)
PREFILLED_SYRINGE | INTRAVENOUS | Status: DC | PRN
  Administered 2017-01-20: 40 ug via INTRAVENOUS
  Administered 2017-01-20: 80 ug via INTRAVENOUS
  Administered 2017-01-20 (×2): 40 ug via INTRAVENOUS

## 2017-01-20 MED ORDER — DEXAMETHASONE SODIUM PHOSPHATE 10 MG/ML IJ SOLN
10.0000 mg | Freq: Once | INTRAMUSCULAR | Status: AC
  Administered 2017-01-21: 10 mg via INTRAVENOUS
  Filled 2017-01-20: qty 1

## 2017-01-20 MED ORDER — BUPIVACAINE LIPOSOME 1.3 % IJ SUSP
20.0000 mL | Freq: Once | INTRAMUSCULAR | Status: DC
  Filled 2017-01-20: qty 20

## 2017-01-20 MED ORDER — OXYCODONE HCL 5 MG/5ML PO SOLN: 5.0000 mg | Freq: Once | ORAL | Status: DC | PRN

## 2017-01-20 SURGICAL SUPPLY — 53 items
BAG DECANTER FOR FLEXI CONT (MISCELLANEOUS) ×2 IMPLANT
BAG SPEC THK2 15X12 ZIP CLS (MISCELLANEOUS) ×1
BAG ZIPLOCK 12X15 (MISCELLANEOUS) ×2 IMPLANT
BANDAGE ACE 6X5 VEL STRL LF (GAUZE/BANDAGES/DRESSINGS) ×2 IMPLANT
BLADE SAG 18X100X1.27 (BLADE) ×2 IMPLANT
BLADE SAW SGTL 11.0X1.19X90.0M (BLADE) ×2 IMPLANT
BOWL SMART MIX CTS (DISPOSABLE) ×2 IMPLANT
CAP KNEE TOTAL 3 SIGMA ×1 IMPLANT
CEMENT HV SMART SET (Cement) ×4 IMPLANT
COVER SURGICAL LIGHT HANDLE (MISCELLANEOUS) ×2 IMPLANT
CUFF TOURN SGL QUICK 34 (TOURNIQUET CUFF) ×2
CUFF TRNQT CYL 34X4X40X1 (TOURNIQUET CUFF) ×1 IMPLANT
DECANTER SPIKE VIAL GLASS SM (MISCELLANEOUS) ×2 IMPLANT
DRAPE U-SHAPE 47X51 STRL (DRAPES) ×2 IMPLANT
DRSG ADAPTIC 3X8 NADH LF (GAUZE/BANDAGES/DRESSINGS) ×2 IMPLANT
DURAPREP 26ML APPLICATOR (WOUND CARE) ×2 IMPLANT
ELECT REM PT RETURN 15FT ADLT (MISCELLANEOUS) ×2 IMPLANT
EVACUATOR 1/8 PVC DRAIN (DRAIN) ×2 IMPLANT
GAUZE SPONGE 4X4 12PLY STRL (GAUZE/BANDAGES/DRESSINGS) ×2 IMPLANT
GLOVE BIO SURGEON STRL SZ8 (GLOVE) ×2 IMPLANT
GLOVE BIOGEL PI IND STRL 6.5 (GLOVE) IMPLANT
GLOVE BIOGEL PI IND STRL 7.0 (GLOVE) IMPLANT
GLOVE BIOGEL PI IND STRL 7.5 (GLOVE) IMPLANT
GLOVE BIOGEL PI IND STRL 8 (GLOVE) ×1 IMPLANT
GLOVE BIOGEL PI INDICATOR 6.5 (GLOVE) ×1
GLOVE BIOGEL PI INDICATOR 7.0 (GLOVE) ×2
GLOVE BIOGEL PI INDICATOR 7.5 (GLOVE) ×3
GLOVE BIOGEL PI INDICATOR 8 (GLOVE) ×1
GLOVE SURG SS PI 6.5 STRL IVOR (GLOVE) ×1 IMPLANT
GLOVE SURG SS PI 7.5 STRL IVOR (GLOVE) ×1 IMPLANT
GOWN STRL REUS W/ TWL XL LVL3 (GOWN DISPOSABLE) IMPLANT
GOWN STRL REUS W/TWL LRG LVL3 (GOWN DISPOSABLE) ×3 IMPLANT
GOWN STRL REUS W/TWL XL LVL3 (GOWN DISPOSABLE) ×3 IMPLANT
HANDPIECE INTERPULSE COAX TIP (DISPOSABLE) ×2
IMMOBILIZER KNEE 20 (SOFTGOODS) ×2
IMMOBILIZER KNEE 20 THIGH 36 (SOFTGOODS) ×1 IMPLANT
MANIFOLD NEPTUNE II (INSTRUMENTS) ×2 IMPLANT
PACK TOTAL KNEE CUSTOM (KITS) ×2 IMPLANT
PAD ABD 8X10 STRL (GAUZE/BANDAGES/DRESSINGS) ×1 IMPLANT
PADDING CAST COTTON 6X4 STRL (CAST SUPPLIES) ×6 IMPLANT
POSITIONER SURGICAL ARM (MISCELLANEOUS) ×2 IMPLANT
SET HNDPC FAN SPRY TIP SCT (DISPOSABLE) ×1 IMPLANT
STRIP CLOSURE SKIN 1/2X4 (GAUZE/BANDAGES/DRESSINGS) ×4 IMPLANT
SUT MNCRL AB 4-0 PS2 18 (SUTURE) ×2 IMPLANT
SUT STRATAFIX 0 PDS 27 VIOLET (SUTURE) ×2
SUT VIC AB 2-0 CT1 27 (SUTURE) ×6
SUT VIC AB 2-0 CT1 TAPERPNT 27 (SUTURE) ×3 IMPLANT
SUTURE STRATFX 0 PDS 27 VIOLET (SUTURE) ×1 IMPLANT
SYR 30ML LL (SYRINGE) ×4 IMPLANT
SYR 50ML LL SCALE MARK (SYRINGE) ×1 IMPLANT
TRAY FOLEY CATH SILVER 14FR (SET/KITS/TRAYS/PACK) ×1 IMPLANT
WRAP KNEE MAXI GEL POST OP (GAUZE/BANDAGES/DRESSINGS) ×2 IMPLANT
YANKAUER SUCT BULB TIP 10FT TU (MISCELLANEOUS) ×2 IMPLANT

## 2017-01-20 NOTE — Anesthesia Preprocedure Evaluation (Signed)
Anesthesia Evaluation  Patient identified by MRN, date of birth, ID band Patient awake    Reviewed: Allergy & Precautions, H&P , NPO status , Patient's Chart, lab work & pertinent test results  Airway Mallampati: II   Neck ROM: full    Dental   Pulmonary neg pulmonary ROS,    breath sounds clear to auscultation       Cardiovascular hypertension,  Rhythm:regular Rate:Normal     Neuro/Psych    GI/Hepatic   Endo/Other    Renal/GU      Musculoskeletal  (+) Arthritis ,   Abdominal   Peds  Hematology   Anesthesia Other Findings   Reproductive/Obstetrics                             Anesthesia Physical Anesthesia Plan  ASA: II  Anesthesia Plan:    Post-op Pain Management:  Regional for Post-op pain   Induction: Intravenous  PONV Risk Score and Plan: 2 and Ondansetron, Propofol infusion, Dexamethasone, Midazolam and Treatment may vary due to age or medical condition  Airway Management Planned: Simple Face Mask  Additional Equipment:   Intra-op Plan:   Post-operative Plan:   Informed Consent: I have reviewed the patients History and Physical, chart, labs and discussed the procedure including the risks, benefits and alternatives for the proposed anesthesia with the patient or authorized representative who has indicated his/her understanding and acceptance.     Plan Discussed with: CRNA, Anesthesiologist and Surgeon  Anesthesia Plan Comments:         Anesthesia Quick Evaluation

## 2017-01-20 NOTE — Anesthesia Procedure Notes (Signed)
Procedure Name: MAC Date/Time: 01/20/2017 7:20 AM Performed by: Deliah Boston, CRNA Pre-anesthesia Checklist: Patient identified, Emergency Drugs available, Suction available and Patient being monitored Patient Re-evaluated:Patient Re-evaluated prior to induction Oxygen Delivery Method: Simple face mask Placement Confirmation: positive ETCO2 and CO2 detector

## 2017-01-20 NOTE — Discharge Instructions (Addendum)
° °Dr. Frank Aluisio °Total Joint Specialist °[facility] °[address] °[place], [place] [address] °[phone] ° °TOTAL KNEE REPLACEMENT POSTOPERATIVE DIRECTIONS ° °Knee Rehabilitation, Guidelines Following Surgery  °Results after knee surgery are often greatly improved when you follow the exercise, range of motion and muscle strengthening exercises prescribed by your doctor. Safety measures are also important to protect the knee from further injury. Any time any of these exercises cause you to have increased pain or swelling in your knee joint, decrease the amount until you are comfortable again and slowly increase them. If you have problems or questions, call your caregiver or physical therapist for advice.  ° °HOME CARE INSTRUCTIONS  °Remove items at home which could result in a fall. This includes throw rugs or furniture in walking pathways.  °· ICE to the affected knee every three hours for 30 minutes at a time and then as needed for pain and swelling.  Continue to use ice on the knee for pain and swelling from surgery. You may notice swelling that will progress down to the foot and ankle.  This is normal after surgery.  Elevate the leg when you are not up walking on it.   °· Continue to use the breathing machine which will help keep your temperature down.  It is common for your temperature to cycle up and down following surgery, especially at night when you are not up moving around and exerting yourself.  The breathing machine keeps your lungs expanded and your temperature down. °· Do not place pillow under knee, focus on keeping the knee straight while resting ° °DIET °You may resume your previous home diet once your are discharged from the hospital. ° °DRESSING / WOUND CARE / SHOWERING °You may shower 3 days after surgery, but keep the wounds dry during showering.  You may use an occlusive plastic wrap (Press'n Seal for example), NO SOAKING/SUBMERGING IN THE BATHTUB.  If the  bandage gets wet, change with a clean dry gauze.  If the incision gets wet, pat the wound dry with a clean towel. °You may start showering once you are discharged home but do not submerge the incision under water. Just pat the incision dry and apply a dry gauze dressing on daily. °Change the surgical dressing daily and reapply a dry dressing each time. ° °ACTIVITY °Walk with your walker as instructed. °Use walker as long as suggested by your caregivers. °Avoid periods of inactivity such as sitting longer than an hour when not asleep. This helps prevent blood clots.  °You may resume a sexual relationship in one month or when given the OK by your doctor.  °You may return to work once you are cleared by your doctor.  °Do not drive a car for 6 weeks or until released by you surgeon.  °Do not drive while taking narcotics. ° °WEIGHT BEARING °Weight bearing as tolerated with assist device (walker, cane, etc) as directed, use it as long as suggested by your surgeon or therapist, typically at least 4-6 weeks. ° °POSTOPERATIVE CONSTIPATION PROTOCOL °Constipation - defined medically as fewer than three stools per week and severe constipation as less than one stool per week. ° °One of the most common issues patients have following surgery is constipation.  Even if you have a regular bowel pattern at home, your normal regimen is likely to be disrupted due to multiple reasons following surgery.  Combination of anesthesia, postoperative narcotics, change in appetite and fluid intake all can affect your bowels.    In order to avoid complications following surgery, here are some recommendations in order to help you during your recovery period. ° °Colace (docusate) - Pick up an over-the-counter form of Colace or another stool softener and take twice a day as long as you are requiring postoperative pain medications.  Take with a full glass of water daily.  If you experience loose stools or diarrhea, hold the colace until you stool forms  back up.  If your symptoms do not get better within 1 week or if they get worse, check with your doctor. ° °Dulcolax (bisacodyl) - Pick up over-the-counter and take as directed by the product packaging as needed to assist with the movement of your bowels.  Take with a full glass of water.  Use this product as needed if not relieved by Colace only.  ° °MiraLax (polyethylene glycol) - Pick up over-the-counter to have on hand.  MiraLax is a solution that will increase the amount of water in your bowels to assist with bowel movements.  Take as directed and can mix with a glass of water, juice, soda, coffee, or tea.  Take if you go more than two days without a movement. °Do not use MiraLax more than once per day. Call your doctor if you are still constipated or irregular after using this medication for 7 days in a row. ° °If you continue to have problems with postoperative constipation, please contact the office for further assistance and recommendations.  If you experience "the worst abdominal pain ever" or develop nausea or vomiting, please contact the office immediatly for further recommendations for treatment. ° °ITCHING ° If you experience itching with your medications, try taking only a single pain pill, or even half a pain pill at a time.  You can also use Benadryl over the counter for itching or also to help with sleep.  ° °TED HOSE STOCKINGS °Wear the elastic stockings on both legs for three weeks following surgery during the day but you may remove then at night for sleeping. ° °MEDICATIONS °See your medication summary on the “After Visit Summary” that the nursing staff will review with you prior to discharge.  You may have some home medications which will be placed on hold until you complete the course of blood thinner medication.  It is important for you to complete the blood thinner medication as prescribed by your surgeon.  Continue your approved medications as instructed at time of  discharge. ° °PRECAUTIONS °If you experience chest pain or shortness of breath - call 911 immediately for transfer to the hospital emergency department.  °If you develop a fever greater that 101 F, purulent drainage from wound, increased redness or drainage from wound, foul odor from the wound/dressing, or calf pain - CONTACT YOUR SURGEON.   °                                                °FOLLOW-UP APPOINTMENTS °Make sure you keep all of your appointments after your operation with your surgeon and caregivers. You should call the office at the above phone number and make an appointment for approximately two weeks after the date of your surgery or on the date instructed by your surgeon outlined in the "After Visit Summary". ° ° °RANGE OF MOTION AND STRENGTHENING EXERCISES  °Rehabilitation of the knee is important following a knee injury or   an operation. After just a few days of immobilization, the muscles of the thigh which control the knee become weakened and shrink (atrophy). Knee exercises are designed to build up the tone and strength of the thigh muscles and to improve knee motion. Often times heat used for twenty to thirty minutes before working out will loosen up your tissues and help with improving the range of motion but do not use heat for the first two weeks following surgery. These exercises can be done on a training (exercise) mat, on the floor, on a table or on a bed. Use what ever works the best and is most comfortable for you Knee exercises include:  °Leg Lifts - While your knee is still immobilized in a splint or cast, you can do straight leg raises. Lift the leg to 60 degrees, hold for 3 sec, and slowly lower the leg. Repeat 10-20 times 2-3 times daily. Perform this exercise against resistance later as your knee gets better.  °Quad and Hamstring Sets - Tighten up the muscle on the front of the thigh (Quad) and hold for 5-10 sec. Repeat this 10-20 times hourly. Hamstring sets are done by pushing the  foot backward against an object and holding for 5-10 sec. Repeat as with quad sets.  °· Leg Slides: Lying on your back, slowly slide your foot toward your buttocks, bending your knee up off the floor (only go as far as is comfortable). Then slowly slide your foot back down until your leg is flat on the floor again. °· Angel Wings: Lying on your back spread your legs to the side as far apart as you can without causing discomfort.  °A rehabilitation program following serious knee injuries can speed recovery and prevent re-injury in the future due to weakened muscles. Contact your doctor or a physical therapist for more information on knee rehabilitation.  ° °IF YOU ARE TRANSFERRED TO A SKILLED REHAB FACILITY °If the patient is transferred to a skilled rehab facility following release from the hospital, a list of the current medications will be sent to the facility for the patient to continue.  When discharged from the skilled rehab facility, please have the facility set up the patient's Home Health Physical Therapy prior to being released. Also, the skilled facility will be responsible for providing the patient with their medications at time of release from the facility to include their pain medication, the muscle relaxants, and their blood thinner medication. If the patient is still at the rehab facility at time of the two week follow up appointment, the skilled rehab facility will also need to assist the patient in arranging follow up appointment in our office and any transportation needs. ° °MAKE SURE YOU:  °Understand these instructions.  °Get help right away if you are not doing well or get worse.  ° ° °Pick up stool softner and laxative for home use following surgery while on pain medications. °Do not submerge incision under water. °Please use good hand washing techniques while changing dressing each day. °May shower starting three days after surgery. °Please use a clean towel to pat the incision dry following  showers. °Continue to use ice for pain and swelling after surgery. °Do not use any lotions or creams on the incision until instructed by your surgeon. ° °Take Xarelto for two and a half more weeks following discharge from the hospital, then discontinue Xarelto. °Once the patient has completed the blood thinner regimen, then take a Baby 81 mg Aspirin daily for three   more weeks.    Information on my medicine - XARELTO (Rivaroxaban)  This medication education was reviewed with me or my healthcare representative as part of my discharge preparation.  The pharmacist that spoke with me during my hospital stay was:   Why was Xarelto prescribed for you? Xarelto was prescribed for you to reduce the risk of blood clots forming after orthopedic surgery. The medical term for these abnormal blood clots is venous thromboembolism (VTE).  What do you need to know about xarelto ? Take your Xarelto ONCE DAILY at the same time every day. You may take it either with or without food.  If you have difficulty swallowing the tablet whole, you may crush it and mix in applesauce just prior to taking your dose.  Take Xarelto exactly as prescribed by your doctor and DO NOT stop taking Xarelto without talking to the doctor who prescribed the medication.  Stopping without other VTE prevention medication to take the place of Xarelto may increase your risk of developing a clot.  After discharge, you should have regular check-up appointments with your healthcare provider that is prescribing your Xarelto.    What do you do if you miss a dose? If you miss a dose, take it as soon as you remember on the same day then continue your regularly scheduled once daily regimen the next day. Do not take two doses of Xarelto on the same day.   Important Safety Information A possible side effect of Xarelto is bleeding. You should call your healthcare provider right away if you experience any of the following: ? Bleeding from an  injury or your nose that does not stop. ? Unusual colored urine (red or dark brown) or unusual colored stools (red or black). ? Unusual bruising for unknown reasons. ? A serious fall or if you hit your head (even if there is no bleeding).  Some medicines may interact with Xarelto and might increase your risk of bleeding while on Xarelto. To help avoid this, consult your healthcare provider or pharmacist prior to using any new prescription or non-prescription medications, including herbals, vitamins, non-steroidal anti-inflammatory drugs (NSAIDs) and supplements.  This website has more information on Xarelto: [email or web address]

## 2017-01-20 NOTE — Anesthesia Procedure Notes (Signed)
Anesthesia Regional Block: Adductor canal block   Pre-Anesthetic Checklist: ,, timeout performed, Correct Patient, Correct Site, Correct Laterality, Correct Procedure, Correct Position, site marked, Risks and benefits discussed,  Surgical consent,  Pre-op evaluation,  At surgeon's request and post-op pain management  Laterality: Right  Prep: chloraprep       Needles:  Injection technique: Single-shot  Needle Type: Echogenic Needle     Needle Length: 9cm  Needle Gauge: 21     Additional Needles:   Narrative:  Start time: 01/20/2017 7:08 AM End time: 01/20/2017 7:11 AM Injection made incrementally with aspirations every 5 mL.  Performed by: Personally  Anesthesiologist: Albertha Ghee, MD  Additional Notes: Pt tolerated the procedure well.

## 2017-01-20 NOTE — Interval H&P Note (Signed)
History and Physical Interval Note:  01/20/2017 6:34 AM  Carol Levy  has presented today for surgery, with the diagnosis of Osteoarthritis Right Knee  The various methods of treatment have been discussed with the patient and family. After consideration of risks, benefits and other options for treatment, the patient has consented to  Procedure(s): RIGHT TOTAL KNEE ARTHROPLASTY (Right) as a surgical intervention .  The patient's history has been reviewed, patient examined, no change in status, stable for surgery.  I have reviewed the patient's chart and labs.  Questions were answered to the patient's satisfaction.     Pilar Plate Tinleigh Whitmire

## 2017-01-20 NOTE — Transfer of Care (Signed)
Immediate Anesthesia Transfer of Care Note  Patient: Carol Levy  Procedure(s) Performed: Procedure(s) with comments: RIGHT TOTAL KNEE ARTHROPLASTY (Right) - Adductor Block  Patient Location: PACU  Anesthesia Type:MAC, Regional and Spinal  Level of Consciousness: Patient easily awoken, sedated, comfortable, cooperative, following commands, responds to stimulation.   Airway & Oxygen Therapy: Patient spontaneously breathing, ventilating well, oxygen via simple oxygen mask.  Post-op Assessment: Report given to PACU RN, vital signs reviewed and stable.   Post vital signs: Reviewed and stable.  Complications: No apparent anesthesia complications Last Vitals:  Vitals:   01/20/17 0558  BP: 106/66  Pulse: 65  Resp: 16  Temp: 36.7 C  SpO2: 100%    Last Pain:  Vitals:   01/20/17 0558  TempSrc: Oral      Patients Stated Pain Goal: 4 (33/38/32 9191)  Complications: No apparent anesthesia complications

## 2017-01-20 NOTE — Anesthesia Postprocedure Evaluation (Signed)
Anesthesia Post Note  Patient: Carol Levy  Procedure(s) Performed: RIGHT TOTAL KNEE ARTHROPLASTY (Right Knee)     Patient location during evaluation: PACU Anesthesia Type: Spinal Level of consciousness: oriented and awake and alert Pain management: pain level controlled Vital Signs Assessment: post-procedure vital signs reviewed and stable Respiratory status: spontaneous breathing, respiratory function stable and patient connected to nasal cannula oxygen Cardiovascular status: blood pressure returned to baseline and stable Postop Assessment: no headache, no backache and no apparent nausea or vomiting Anesthetic complications: no    Last Vitals:  Vitals:   01/20/17 1112 01/20/17 1212  BP: 102/68 (!) 90/59  Pulse: 62 63  Resp: 15 15  Temp: (!) 36.3 C 36.5 C  SpO2: 99% 97%    Last Pain:  Vitals:   01/20/17 1212  TempSrc: Oral  PainSc:                  [place]

## 2017-01-20 NOTE — Op Note (Signed)
OPERATIVE REPORT-TOTAL KNEE ARTHROPLASTY   Pre-operative diagnosis- Osteoarthritis  Right knee(s)  Post-operative diagnosis- Osteoarthritis Right knee(s)  Procedure-  Right  Total Knee Arthroplasty  Surgeon- Dione Plover. Chiante Peden, MD  Assistant- Ardeen Jourdain, PA-C   Anesthesia-  Adductor canal block and spinal  EBL-50 ml   Drains Hemovac  Tourniquet time-  Total Tourniquet Time Documented: Thigh (Right) - 29 minutes Total: Thigh (Right) - 29 minutes     Complications- None  Condition-PACU - hemodynamically stable.   Brief Clinical Note  Carol Levy is a 71 y.o. year old female with end stage OA of her right knee with progressively worsening pain and dysfunction. She has constant pain, with activity and at rest and significant functional deficits with difficulties even with ADLs. She has had extensive non-op management including analgesics, injections of cortisone, and home exercise program, but remains in significant pain with significant dysfunction.Radiographs show bone on bone arthritis medial and patellofemoral. She presents now for right Total Knee Arthroplasty.    Procedure in detail---   The patient is brought into the operating room and positioned supine on the operating table. After successful administration of  Adductor canal block and spinal,   a tourniquet is placed high on the  Right thigh(s) and the lower extremity is prepped and draped in the usual sterile fashion. Time out is performed by the operating team and then the  Right lower extremity is wrapped in Esmarch, knee flexed and the tourniquet inflated to 300 mmHg.       A midline incision is made with a ten blade through the subcutaneous tissue to the level of the extensor mechanism. A fresh blade is used to make a medial parapatellar arthrotomy. Soft tissue over the proximal medial tibia is subperiosteally elevated to the joint line with a knife and into the semimembranosus bursa with a Cobb elevator. Soft  tissue over the proximal lateral tibia is elevated with attention being paid to avoiding the patellar tendon on the tibial tubercle. The patella is everted, knee flexed 90 degrees and the ACL and PCL are removed. Findings are bone on bone medial and patellofemoral with large global osteophytes.        The drill is used to create a starting hole in the distal femur and the canal is thoroughly irrigated with sterile saline to remove the fatty contents. The 5 degree Right  valgus alignment guide is placed into the femoral canal and the distal femoral cutting block is pinned to remove 10 mm off the distal femur. Resection is made with an oscillating saw.      The tibia is subluxed forward and the menisci are removed. The extramedullary alignment guide is placed referencing proximally at the medial aspect of the tibial tubercle and distally along the second metatarsal axis and tibial crest. The block is pinned to remove 56mm off the more deficient medial  side. Resection is made with an oscillating saw. Size 3is the most appropriate size for the tibia and the proximal tibia is prepared with the modular drill and keel punch for that size.      The femoral sizing guide is placed and size 3 is most appropriate. Rotation is marked off the epicondylar axis and confirmed by creating a rectangular flexion gap at 90 degrees. The size 3 cutting block is pinned in this rotation and the anterior, posterior and chamfer cuts are made with the oscillating saw. The intercondylar block is then placed and that cut is made.  Trial size 3 tibial component, trial size 3 posterior stabilized femur and a 15  mm posterior stabilized rotating platform insert trial is placed. Full extension is achieved with excellent varus/valgus and anterior/posterior balance throughout full range of motion. The patella is everted and thickness measured to be 22  mm. Free hand resection is taken to 12 mm, a 35 template is placed, lug holes are drilled,  trial patella is placed, and it tracks normally. Osteophytes are removed off the posterior femur with the trial in place. All trials are removed and the cut bone surfaces prepared with pulsatile lavage. Cement is mixed and once ready for implantation, the size 3 tibial implant, size  3 posterior stabilized femoral component, and the size 35 patella are cemented in place and the patella is held with the clamp. The trial insert is placed and the knee held in full extension. The Exparel (20 ml mixed with 60 ml saline) is injected into the extensor mechanism, posterior capsule, medial and lateral gutters and subcutaneous tissues.  All extruded cement is removed and once the cement is hard the permanent 15 mm posterior stabilized rotating platform insert is placed into the tibial tray.      The wound is copiously irrigated with saline solution and the extensor mechanism closed over a hemovac drain with #1 V-loc suture. The tourniquet is released for a total tourniquet time of 29  minutes. Flexion against gravity is 140 degrees and the patella tracks normally. Subcutaneous tissue is closed with 2.0 vicryl and subcuticular with running 4.0 Monocryl. The incision is cleaned and dried and steri-strips and a bulky sterile dressing are applied. The limb is placed into a knee immobilizer and the patient is awakened and transported to recovery in stable condition.      Please note that a surgical assistant was a medical necessity for this procedure in order to perform it in a safe and expeditious manner. Surgical assistant was necessary to retract the ligaments and vital neurovascular structures to prevent injury to them and also necessary for proper positioning of the limb to allow for anatomic placement of the prosthesis.   [name], MD    01/20/2017, 8:16 AM

## 2017-01-20 NOTE — Anesthesia Procedure Notes (Signed)
Spinal  Patient location during procedure: OR Start time: 01/20/2017 7:22 AM End time: 01/20/2017 7:25 AM Staffing Anesthesiologist: Albertha Ghee, MD Performed: anesthesiologist  Preanesthetic Checklist Completed: patient identified, surgical consent, pre-op evaluation, timeout performed, IV checked, risks and benefits discussed and monitors and equipment checked Spinal Block Patient position: sitting Prep: DuraPrep Patient monitoring: cardiac monitor, continuous pulse ox and blood pressure Approach: midline Location: L3-4 Injection technique: single-shot Needle Needle type: Pencan  Needle gauge: 24 G Needle length: 9 cm Assessment Sensory level: T10 Additional Notes Functioning IV was confirmed and monitors were applied. Sterile prep and drape, including hand hygiene and sterile gloves were used. The patient was positioned and the spine was prepped. The skin was anesthetized with lidocaine.  Free flow of clear CSF was obtained prior to injecting local anesthetic into the CSF.  The spinal needle aspirated freely following injection.  The needle was carefully withdrawn.  The patient tolerated the procedure well.

## 2017-01-21 LAB — CBC
HCT: 32.1 % — ABNORMAL LOW (ref 36.0–46.0)
Hemoglobin: 10.7 g/dL — ABNORMAL LOW (ref 12.0–15.0)
MCH: 29.8 pg (ref 26.0–34.0)
MCHC: 33.3 g/dL (ref 30.0–36.0)
MCV: 89.4 fL (ref 78.0–100.0)
PLATELETS: 132 10*3/uL — AB (ref 150–400)
RBC: 3.59 MIL/uL — ABNORMAL LOW (ref 3.87–5.11)
RDW: 13.1 % (ref 11.5–15.5)
WBC: 8.4 10*3/uL (ref 4.0–10.5)

## 2017-01-21 LAB — BASIC METABOLIC PANEL
ANION GAP: 6 (ref 5–15)
BUN: 19 mg/dL (ref 6–20)
CALCIUM: 9.1 mg/dL (ref 8.9–10.3)
CO2: 22 mmol/L (ref 22–32)
Chloride: 112 mmol/L — ABNORMAL HIGH (ref 101–111)
Creatinine, Ser: 0.91 mg/dL (ref 0.44–1.00)
GFR calc Af Amer: >60 mL/min (ref >60)
Glucose, Bld: 110 mg/dL — ABNORMAL HIGH (ref 65–99)
Potassium: 3.9 mmol/L (ref 3.5–5.1)
Sodium: 140 mmol/L (ref 135–145)

## 2017-01-21 MED ORDER — TIZANIDINE HCL 4 MG PO TABS
4.0000 mg | ORAL_TABLET | Freq: Three times a day (TID) | ORAL | 0 refills | Status: AC
Start: 2017-01-21 — End: 2017-02-20

## 2017-01-21 MED ORDER — RIVAROXABAN 10 MG PO TABS: 10.0000 mg | ORAL_TABLET | Freq: Every day | ORAL | 0 refills | Status: DC

## 2017-01-21 MED ORDER — SODIUM CHLORIDE 0.9 % IV BOLUS (SEPSIS)
250.0000 mL | Freq: Once | INTRAVENOUS | Status: AC
  Administered 2017-01-21: 250 mL via INTRAVENOUS

## 2017-01-21 MED ORDER — TRAMADOL HCL 50 MG PO TABS: 50.0000 mg | ORAL_TABLET | Freq: Four times a day (QID) | ORAL | 0 refills | Status: DC | PRN

## 2017-01-21 MED ORDER — OXYCODONE HCL 5 MG PO TABS: 5.0000 mg | ORAL_TABLET | ORAL | 0 refills | Status: DC | PRN

## 2017-01-21 NOTE — Progress Notes (Signed)
Discharge planning, spoke with patient at bedside. Have chosen [facility]. Contacted [facility] for referral. Has RW, declines 3n1. [phone]

## 2017-01-21 NOTE — Evaluation (Signed)
Physical Therapy Evaluation Patient Details Name: Carol Levy MRN: [id number] DOB: Aug 01, 1945 Today's Date: 01/21/2017   History of Present Illness  s/p R TKA; Hx: essential tremors  Clinical Impression  Pt is s/p TKA resulting in the deficits listed below (see PT Problem List). * Pt will benefit from skilled PT to increase their independence and safety with mobility to allow discharge to the venue listed below.  Plan is for home with HHPT, will work on HEP later today since  pain incr with amb this am    Follow Up Recommendations Home health PT;DC plan and follow up therapy as arranged by surgeon    Equipment Recommendations  None recommended by PT    Recommendations for Other Services       Precautions / Restrictions Precautions Precautions: Fall;Knee Required Braces or Orthoses: Knee Immobilizer - Right Knee Immobilizer - Right: Discontinue once straight leg raise with < 10 degree lag Restrictions Weight Bearing Restrictions: No Other Position/Activity Restrictions: WBAT      Mobility  Bed Mobility Overal bed mobility: Needs Assistance Bed Mobility: Supine to Sit     Supine to sit: Min assist     General bed mobility comments: assist with RLE  Transfers Overall transfer level: Needs assistance Equipment used: Rolling walker (2 wheeled) Transfers: Sit to/from Stand Sit to Stand: Min assist         General transfer comment: cues for hand placement  Ambulation/Gait Ambulation/Gait assistance: Min assist Ambulation Distance (Feet): 120 Feet Assistive device: Rolling walker (2 wheeled) Gait Pattern/deviations: Step-to pattern     General Gait Details: cues for sequence and RW position/safety  Stairs            Wheelchair Mobility    Modified Rankin (Stroke Patients Only)       Balance Overall balance assessment: Needs assistance   Sitting balance-Leahy Scale: Good     Standing balance support: Single extremity supported;Bilateral  upper extremity supported;During functional activity Standing balance-Leahy Scale: Poor Standing balance comment: reliant on UE support for safe standing                              Pertinent Vitals/Pain Pain Assessment: 0-10 Pain Score: 3  Pain Location: right knee Pain Descriptors / Indicators: Sore Pain Intervention(s): Premedicated before session;Monitored during session;Limited activity within patient's tolerance;Repositioned    Home Living Family/patient expects to be discharged to:: Private residence Living Arrangements: Spouse/significant other   Type of Home: House Home Access: Stairs to enter Entrance Stairs-Rails: None Entrance Stairs-Number of Steps: 2 Home Layout: Two level;Able to live on main level with bedroom/bathroom(plans to sleep in recliner first level) Home Equipment: Gilford Rile - 2 wheels      Prior Function                 Hand Dominance        Extremity/Trunk Assessment   Upper Extremity Assessment Upper Extremity Assessment: Defer to OT evaluation    Lower Extremity Assessment Lower Extremity Assessment: RLE deficits/detail RLE Deficits / Details: ankle WFL; knee limited by anticipated post op pain and weakness       Communication      Cognition Arousal/Alertness: Awake/alert Behavior During Therapy: WFL for tasks assessed/performed Overall Cognitive Status: Within Functional Limits for tasks assessed  General Comments      Exercises     Assessment/Plan    PT Assessment Patient needs continued PT services  PT Problem List Decreased strength;Decreased range of motion;Decreased activity tolerance;Decreased balance;Decreased mobility;Decreased knowledge of use of DME;Pain       PT Treatment Interventions DME instruction;Gait training;Functional mobility training;Therapeutic activities;Therapeutic exercise;Patient/family education;Stair training    PT Goals  (Current goals can be found in the Care Plan section)  Acute Rehab PT Goals Patient Stated Goal: less knee pain PT Goal Formulation: With patient Potential to Achieve Goals: Good    Frequency 7X/week   Barriers to discharge        Co-evaluation               AM-PAC PT "6 Clicks" Daily Activity  Outcome Measure Difficulty turning over in bed (including adjusting bedclothes, sheets and blankets)?: Unable Difficulty moving from lying on back to sitting on the side of the bed? : Unable Difficulty sitting down on and standing up from a chair with arms (e.g., wheelchair, bedside commode, etc,.)?: A Little Help needed moving to and from a bed to chair (including a wheelchair)?: A Little Help needed walking in hospital room?: A Little Help needed climbing 3-5 steps with a railing? : A Little 6 Click Score: 14    End of Session Equipment Utilized During Treatment: Gait belt Activity Tolerance: Patient tolerated treatment well Patient left: in chair;with call bell/phone within reach;with chair alarm set   PT Visit Diagnosis: Difficulty in walking, not elsewhere classified (R26.2);Pain Pain - Right/Left: Right Pain - part of body: Knee    Time: 1101-1118 PT Time Calculation (min) (ACUTE ONLY): 17 min   Charges:   PT Evaluation $PT Eval Low Complexity: 1 Low     PT G Codes:          Erique Kaser 2017-02-20, 11:57 AM

## 2017-01-21 NOTE — Progress Notes (Signed)
   Subjective: 1 Day Post-Op Procedure(s) (LRB): RIGHT TOTAL KNEE ARTHROPLASTY (Right) Patient reports pain as mild.   Patient seen in rounds for Dr. Wynelle Link earlier on morning rounds. Patient is well, but has had some minor complaints of pain in the knee, requiring pain medications We will start therapy today.  Plan is to go Home after hospital stay.  Objective: Vital signs in last 24 hours: Temp:  [97.8 F (36.6 C)-98.6 F (37 C)] 98.6 F (37 C) (11/27 1405) Pulse Rate:  [62-68] 68 (11/27 0924) Resp:  [13-16] 14 (11/27 1405) BP: (97-117)/(55-65) 100/60 (11/27 1405) SpO2:  [98 %-99 %] 99 % (11/27 0539)  Intake/Output from previous day:  Intake/Output Summary (Last 24 hours) at 01/21/2017 2134 Last data filed at 01/21/2017 1459 Gross per 24 hour  Intake 2052.5 ml  Output 1600 ml  Net 452.5 ml    Intake/Output this shift: No intake/output data recorded.  Labs: Recent Labs    01/21/17 0549  HGB 10.7*   Recent Labs    01/21/17 0549  WBC 8.4  RBC 3.59*  HCT 32.1*  PLT 132*   Recent Labs    01/21/17 0549  NA 140  K 3.9  CL 112*  CO2 22  BUN 19  CREATININE 0.91  GLUCOSE 110*  CALCIUM 9.1   No results for input(s): LABPT, INR in the last 72 hours.  EXAM General - Patient is Alert and Appropriate Extremity - Neurovascular intact Sensation intact distally Intact pulses distally Dorsiflexion/Plantar flexion intact Dressing - dressing C/D/I Motor Function - intact, moving foot and toes well on exam.  Hemovac pulled without difficulty.  Past Medical History:  Diagnosis Date  . Anemia    hx of   . Arthritis   . Breast cancer ([place])    L breast IDC in 2004, R breast IDC with DCIS in 2008  . Essential tremor   . Family history of adverse reaction to anesthesia    mother- mother had reaction to some anesthesia years ago per patient   . History of chemotherapy    4 cycles TCH with Herceptin for one year in 2008  . Hypercholesterolemia   . Hypertension    . S/P radiation therapy    12/01/02 to 01/19/03  . Seasonal allergies     Assessment/Plan: 1 Day Post-Op Procedure(s) (LRB): RIGHT TOTAL KNEE ARTHROPLASTY (Right) Principal Problem:   OA (osteoarthritis) of knee  Estimated body mass index is 23.73 kg/m as calculated from the following:   Height as of this encounter: 5\' 6"  (1.676 m).   Weight as of this encounter: 66.7 kg (147 lb). Advance diet Up with therapy Plan for discharge tomorrow Discharge home with home health  DVT Prophylaxis - Xarelto Weight-Bearing as tolerated to right leg D/C O2 and Pulse OX and try on Room Air  Arlee Muslim, PA-C Orthopaedic Surgery 01/21/2017, 9:34 PM

## 2017-01-21 NOTE — Progress Notes (Signed)
Physical Therapy Treatment Patient Details Name: Carol Levy MRN: [id number] DOB: 23-Sep-1945 Today's Date: 01/21/2017    History of Present Illness s/p R TKA; Hx: essential tremors    PT Comments    Progressing well; has ~2 steps and will need to practice tomorrow; doing well with mobility and knee ROM   Follow Up Recommendations  Home health PT;DC plan and follow up therapy as arranged by surgeon     Equipment Recommendations  None recommended by PT    Recommendations for Other Services       Precautions / Restrictions Precautions Precautions: Fall;Knee Precaution Comments: IND SLRs Required Braces or Orthoses: Knee Immobilizer - Right Knee Immobilizer - Right: Discontinue once straight leg raise with < 10 degree lag Restrictions Weight Bearing Restrictions: No Other Position/Activity Restrictions: WBAT    Mobility  Bed Mobility Overal bed mobility: Needs Assistance Bed Mobility: Sit to Supine     Supine to sit: Min assist Sit to supine: Supervision   General bed mobility comments: oob  Transfers Overall transfer level: Needs assistance Equipment used: Rolling walker (2 wheeled) Transfers: Sit to/from Stand Sit to Stand: Min guard         General transfer comment: for safety, cues for hand placement   Ambulation/Gait Ambulation/Gait assistance: Min guard Ambulation Distance (Feet): 15 Feet(x2) Assistive device: Rolling walker (2 wheeled) Gait Pattern/deviations: Step-to pattern     General Gait Details: cues for sequence and RW position/safety   Stairs            Wheelchair Mobility    Modified Rankin (Stroke Patients Only)       Balance Overall balance assessment: Needs assistance   Sitting balance-Leahy Scale: Good     Standing balance support: During functional activity;No upper extremity supported;Single extremity supported Standing balance-Leahy Scale: Fair Standing balance comment: blance improved; pt able to stnad and  brush teeth (per her request) with close supervision                            Cognition Arousal/Alertness: Awake/alert Behavior During Therapy: WFL for tasks assessed/performed Overall Cognitive Status: Within Functional Limits for tasks assessed                                        Exercises Total Joint Exercises Ankle Circles/Pumps: AROM;10 reps;Both Quad Sets: AROM;Both;10 reps Short Arc Quad: AROM;Right;10 reps Heel Slides: AAROM;Right;10 reps Hip ABduction/ADduction: AROM;Strengthening;Right;10 reps Straight Leg Raises: AROM;Strengthening;Right;10 reps Goniometric ROM: grossly 5* to 65*    General Comments        Pertinent Vitals/Pain Pain Assessment: 0-10 Pain Score: 3  Pain Location: right knee Pain Descriptors / Indicators: Sore Pain Intervention(s): Limited activity within patient's tolerance;Monitored during session    Home Living Family/patient expects to be discharged to:: Private residence Living Arrangements: Spouse/significant other Available Help at Discharge: Family Type of Home: House Home Access: Stairs to enter Entrance Stairs-Rails: None Home Layout: Two level;Able to live on main level with bedroom/bathroom(plans to sleep in recliner first level) Home Equipment: Walker - 2 wheels Additional Comments: pt does not have anything next to commode but toilet is higher than ours    Prior Function Level of Independence: Independent          PT Goals (current goals can now be found in the care plan section) Acute Rehab PT Goals Patient Stated Goal:  less knee pain PT Goal Formulation: With patient Potential to Achieve Goals: Good Progress towards PT goals: Progressing toward goals    Frequency    7X/week      PT Plan Current plan remains appropriate    Co-evaluation              AM-PAC PT "6 Clicks" Daily Activity  Outcome Measure  Difficulty turning over in bed (including adjusting bedclothes, sheets  and blankets)?: A Little Difficulty moving from lying on back to sitting on the side of the bed? : A Little Difficulty sitting down on and standing up from a chair with arms (e.g., wheelchair, bedside commode, etc,.)?: A Little Help needed moving to and from a bed to chair (including a wheelchair)?: A Little Help needed walking in hospital room?: A Little Help needed climbing 3-5 steps with a railing? : A Little 6 Click Score: 18    End of Session Equipment Utilized During Treatment: Gait belt Activity Tolerance: Patient tolerated treatment well Patient left: in bed;with call bell/phone within reach;with bed alarm set   PT Visit Diagnosis: Difficulty in walking, not elsewhere classified (R26.2);Pain Pain - Right/Left: Right Pain - part of body: Knee     Time: 1355-1419 PT Time Calculation (min) (ACUTE ONLY): 24 min  Charges:  $Gait Training: 8-22 mins $Therapeutic Exercise: 8-22 mins                    G Codes:          Shanah Guimaraes 2017-01-23, 2:28 PM

## 2017-01-21 NOTE — Evaluation (Signed)
Occupational Therapy Evaluation Patient Details Name: Carol Levy MRN: [id number] DOB: [birth year] Today's Date: 01/21/2017    History of Present Illness s/p R TKA; Hx: essential tremors   Clinical Impression   This 71 year old female was admitted for the above sx. All education was completed. No further OT is needed at this time    Follow Up Recommendations  No OT follow up    Equipment Recommendations  None recommended by OT    Recommendations for Other Services       Precautions / Restrictions Precautions Precautions: Fall;Knee Required Braces or Orthoses: Knee Immobilizer - Right Knee Immobilizer - Right: Discontinue once straight leg raise with < 10 degree lag Restrictions Weight Bearing Restrictions: No Other Position/Activity Restrictions: WBAT      Mobility Bed Mobility Overal bed mobility: Needs Assistance Bed Mobility: Supine to Sit     Supine to sit: Min assist     General bed mobility comments: oob  Transfers Overall transfer level: Needs assistance Equipment used: Rolling walker (2 wheeled) Transfers: Sit to/from Stand Sit to Stand: Min guard         General transfer comment: for safety    Balance Overall balance assessment: Needs assistance   Sitting balance-Leahy Scale: Good     Standing balance support: Single extremity supported;Bilateral upper extremity supported;During functional activity Standing balance-Leahy Scale: Poor Standing balance comment: reliant on UE support for safe standing                            ADL either performed or assessed with clinical judgement   ADL Overall ADL's : Needs assistance/impaired Eating/Feeding: Independent   Grooming: Min guard;Standing;Wash/dry hands   Upper Body Bathing: Set up;Sitting   Lower Body Bathing: Minimal assistance;Sit to/from stand   Upper Body Dressing : Set up;Sitting   Lower Body Dressing: Moderate assistance;Sitting/lateral leans   Toilet Transfer:  Min guard;Transfer board;Comfort height toilet;RW   Toileting- Water quality scientist and Hygiene: Min guard;Sit to/from stand         General ADL Comments: ambulated to bathroom. Demonstrated shower transfer and gave her a handout with sequence. She did not want to practice today.  Reviewed precautions and general safety     Vision         Perception     Praxis      Pertinent Vitals/Pain Pain Assessment: 0-10 Pain Score: 3  Pain Location: right knee Pain Descriptors / Indicators: Sore Pain Intervention(s): Limited activity within patient's tolerance;Monitored during session;Premedicated before session;Repositioned;Ice applied     Hand Dominance     Extremity/Trunk Assessment Upper Extremity Assessment Upper Extremity Assessment: Overall WFL for tasks assessed(has essential tremor)          Communication Communication Communication: No difficulties   Cognition Arousal/Alertness: Awake/alert Behavior During Therapy: WFL for tasks assessed/performed Overall Cognitive Status: Within Functional Limits for tasks assessed                                     General Comments       Exercises     Shoulder Instructions      Home Living Family/patient expects to be discharged to:: Private residence Living Arrangements: Spouse/significant other Available Help at Discharge: Family Type of Home: House Home Access: Stairs to enter [employer] of Steps: 2 Entrance Stairs-Rails: None Home Layout: Two level;Able to live on main level  with bedroom/bathroom(plans to sleep in recliner first level) Alternate Level Stairs-Number of Steps: has chair lift   Bathroom Shower/Tub: Walk-in shower(upstiars)   Bathroom Toilet: Handicapped height     Home Equipment: Environmental consultant - 2 wheels   Additional Comments: pt does not have anything next to commode but toilet is higher than ours      Prior Functioning/Environment Level of Independence: Independent                  OT Problem List:        OT Treatment/Interventions:      OT Goals(Current goals can be found in the care plan section) Acute Rehab OT Goals Patient Stated Goal: less knee pain OT Goal Formulation: All assessment and education complete, DC therapy  OT Frequency:     Barriers to D/C:            Co-evaluation              AM-PAC PT "6 Clicks" Daily Activity     Outcome Measure Help from another person eating meals?: None Help from another person taking care of personal grooming?: A Little Help from another person toileting, which includes using toliet, bedpan, or urinal?: A Little Help from another person bathing (including washing, rinsing, drying)?: A Little Help from another person to put on and taking off regular upper body clothing?: A Little Help from another person to put on and taking off regular lower body clothing?: A Lot 6 Click Score: 18   End of Session    Activity Tolerance: Patient tolerated treatment well Patient left: in chair;with call bell/phone within reach;with chair alarm set  OT Visit Diagnosis: Pain Pain - Right/Left: Right Pain - part of body: Knee                Time: 1125-1145 OT Time Calculation (min): 20 min Charges:  OT General Charges $OT Visit: 1 Visit OT Evaluation $OT Eval Low Complexity: 1 Low G-Codes:     [place], OTR/L [phone] 01/21/2017  Carol Levy 01/21/2017, 1:30 PM

## 2017-01-21 NOTE — Discharge Summary (Signed)
Physician Discharge Summary   Patient ID: Carol Levy MRN: [id number] DOB/AGE: 1945/08/16 71 y.o.  Admit date: 01/20/2017 Discharge date: 01/22/2017  Primary Diagnosis: Osteoarthritis right knee  Admission Diagnoses:  Past Medical History:  Diagnosis Date  . Anemia    hx of   . Arthritis   . Breast cancer ([place])    L breast IDC in 2004, R breast IDC with DCIS in 2008  . Essential tremor   . Family history of adverse reaction to anesthesia    mother- mother had reaction to some anesthesia years ago per patient   . History of chemotherapy    4 cycles TCH with Herceptin for one year in 2008  . Hypercholesterolemia   . Hypertension   . S/P radiation therapy    12/01/02 to 01/19/03  . Seasonal allergies    Discharge Diagnoses:   Principal Problem:   OA (osteoarthritis) of knee  Estimated body mass index is 23.73 kg/m as calculated from the following:   Height as of this encounter: _0  (1.676 m).   Weight as of this encounter: 66.7 kg (147 lb).  Procedure:  Procedure(s) (LRB): RIGHT TOTAL KNEE ARTHROPLASTY (Right)   Consults: None  HPI: Carol Levy is a 71 y.o. year old female with end stage OA of her right knee with progressively worsening pain and dysfunction. She has constant pain, with activity and at rest and significant functional deficits with difficulties even with ADLs. She has had extensive non-op management including analgesics, injections of cortisone, and home exercise program, but remains in significant pain with significant dysfunction.Radiographs show bone on bone arthritis medial and patellofemoral. She presents now for right Total Knee Arthroplasty.     Laboratory Data: Admission on 01/20/2017  Component Date Value Ref Range Status  . WBC 01/21/2017 8.4  4.0 - 10.5 K/uL Final  . RBC 01/21/2017 3.59* 3.87 - 5.11 MIL/uL Final  . Hemoglobin 01/21/2017 10.7* 12.0 - 15.0 g/dL Final  . HCT 01/21/2017 32.1* 36.0 - 46.0 % Final  . MCV 01/21/2017  89.4  78.0 - 100.0 fL Final  . MCH 01/21/2017 29.8  26.0 - 34.0 pg Final  . MCHC 01/21/2017 33.3  30.0 - 36.0 g/dL Final  . RDW 01/21/2017 13.1  11.5 - 15.5 % Final  . Platelets 01/21/2017 132* 150 - 400 K/uL Final  . Sodium 01/21/2017 140  135 - 145 mmol/L Final  . Potassium 01/21/2017 3.9  3.5 - 5.1 mmol/L Final  . Chloride 01/21/2017 112* 101 - 111 mmol/L Final  . CO2 01/21/2017 22  22 - 32 mmol/L Final  . Glucose, Bld 01/21/2017 110* 65 - 99 mg/dL Final  . BUN 01/21/2017 19  6 - 20 mg/dL Final  . Creatinine, Ser 01/21/2017 0.91  0.44 - 1.00 mg/dL Final  . Calcium 01/21/2017 9.1  8.9 - 10.3 mg/dL Final  . GFR calc non Af Amer 01/21/2017 >60  >60 mL/min Final  . GFR calc Af Amer 01/21/2017 >60  >60 mL/min Final   Comment: (NOTE) The eGFR has been calculated using the CKD EPI equation. This calculation has not been validated in all clinical situations. eGFR's persistently <60 mL/min signify possible Chronic Kidney Disease.   Georgiann Hahn gap 01/21/2017 6  5 - 15 Final  Hospital Outpatient Visit on 01/14/2017  Component Date Value Ref Range Status  . aPTT 01/14/2017 29  24 - 36 seconds Final  . WBC 01/14/2017 5.8  4.0 - 10.5 K/uL Final  . RBC 01/14/2017 4.14  3.87 - 5.11 MIL/uL Final  . Hemoglobin 01/14/2017 12.3  12.0 - 15.0 g/dL Final  . HCT 01/14/2017 37.2  36.0 - 46.0 % Final  . MCV 01/14/2017 89.9  78.0 - 100.0 fL Final  . MCH 01/14/2017 29.7  26.0 - 34.0 pg Final  . MCHC 01/14/2017 33.1  30.0 - 36.0 g/dL Final  . RDW 01/14/2017 13.1  11.5 - 15.5 % Final  . Platelets 01/14/2017 217  150 - 400 K/uL Final  . Sodium 01/14/2017 141  135 - 145 mmol/L Final  . Potassium 01/14/2017 3.8  3.5 - 5.1 mmol/L Final  . Chloride 01/14/2017 109  101 - 111 mmol/L Final  . CO2 01/14/2017 26  22 - 32 mmol/L Final  . Glucose, Bld 01/14/2017 106* 65 - 99 mg/dL Final  . BUN 01/14/2017 25* 6 - 20 mg/dL Final  . Creatinine, Ser 01/14/2017 0.76  0.44 - 1.00 mg/dL Final  . Calcium 01/14/2017 10.1  8.9  - 10.3 mg/dL Final  . Total Protein 01/14/2017 6.8  6.5 - 8.1 g/dL Final  . Albumin 01/14/2017 4.0  3.5 - 5.0 g/dL Final  . AST 01/14/2017 32  15 - 41 U/L Final  . ALT 01/14/2017 31  14 - 54 U/L Final  . Alkaline Phosphatase 01/14/2017 68  38 - 126 U/L Final  . Total Bilirubin 01/14/2017 0.8  0.3 - 1.2 mg/dL Final  . GFR calc non Af Amer 01/14/2017 >60  >60 mL/min Final  . GFR calc Af Amer 01/14/2017 >60  >60 mL/min Final   Comment: (NOTE) The eGFR has been calculated using the CKD EPI equation. This calculation has not been validated in all clinical situations. eGFR's persistently <60 mL/min signify possible Chronic Kidney Disease.   . Anion gap 01/14/2017 6  5 - 15 Final  . Prothrombin Time 01/14/2017 12.1  11.4 - 15.2 seconds Final  . INR 01/14/2017 0.90   Final  . ABO/RH(D) 01/14/2017 A POS   Final  . Antibody Screen 01/14/2017 NEG   Final  . Sample Expiration 01/14/2017 01/23/2017   Final  . Extend sample reason 01/14/2017 NO TRANSFUSIONS OR PREGNANCY IN THE PAST 3 MONTHS   Final  . MRSA, PCR 01/14/2017 NEGATIVE  NEGATIVE Final  . Staphylococcus aureus 01/14/2017 POSITIVE* NEGATIVE Final   Comment: (NOTE) The Xpert SA Assay (FDA approved for NASAL specimens in patients 51 years of age and older), is one component of a comprehensive surveillance program. It is not intended to diagnose infection nor to guide or monitor treatment.   . ABO/RH(D) 01/14/2017 A POS   Final     X-Rays:No results found.  EKG: Orders placed or performed during the hospital encounter of 01/14/17  . EKG 12 lead  . EKG 12 lead     Hospital Course: Carol Levy is a 71 y.o. who was admitted to [facility]. They were brought to the operating room on 01/20/2017 and underwent Procedure(s): RIGHT TOTAL KNEE ARTHROPLASTY.  Patient tolerated the procedure well and was later transferred to the recovery room and then to the orthopaedic floor for postoperative care.  They were given PO and IV  analgesics for pain control following their surgery.  They were given 24 hours of postoperative antibiotics of  Anti-infectives (From admission, onward)   Start     Dose/Rate Route Frequency Ordered Stop   01/20/17 1330  ceFAZolin (ANCEF) IVPB 1 g/50 mL premix     1 g 100 mL/hr over 30 Minutes Intravenous Every 6  hours 01/20/17 1024 01/20/17 1914   01/20/17 0523  ceFAZolin (ANCEF) 2-4 GM/100ML-% IVPB    Comments:  [place] Session   : cabinet override      01/20/17 0523 01/20/17 0727   01/20/17 0520  ceFAZolin (ANCEF) IVPB 2g/100 mL premix     2 g 200 mL/hr over 30 Minutes Intravenous On call to O.R. 01/20/17 1448 01/20/17 0727     and started on DVT prophylaxis in the form of Xarelto.   PT and OT were ordered for total joint protocol.  Discharge planning consulted to help with postop disposition and equipment needs.  Patient had a good night on the evening of surgery.  They started to get up OOB with therapy on day one. Hemovac drain was pulled without difficulty.  Continued to work with therapy into day two.  Dressing was changed on day two and the incision was healing well.  Patient was seen in rounds on day two and was ready to go home following therapy.   Diet: Cardiac diet Activity:WBAT Follow-up:in 2 weeks Disposition - Home Discharged Condition: stable   Discharge Instructions    Call MD / Call 911   Complete by:  As directed    If you experience chest pain or shortness of breath, CALL 911 and be transported to the hospital emergency room.  If you develope a fever above 101 F, pus (white drainage) or increased drainage or redness at the wound, or calf pain, call your surgeon's office.   Change dressing   Complete by:  As directed    Change dressing daily with sterile 4 x 4 inch gauze dressing and apply TED hose. Do not submerge the incision under water.   Constipation Prevention   Complete by:  As directed    Drink plenty of fluids.  Prune juice may be helpful.  You may use a  stool softener, such as Colace (over the counter) 100 mg twice a day.  Use MiraLax (over the counter) for constipation as needed.   Diet - low sodium heart healthy   Complete by:  As directed    Discharge instructions   Complete by:  As directed    Take Xarelto for two and a half more weeks, then discontinue Xarelto. Once the patient has completed the blood thinner regimen, then take a Baby 81 mg Aspirin daily for three more weeks.  Pick up stool softner and laxative for home use following surgery while on pain medications. Do not submerge incision under water. Please use good hand washing techniques while changing dressing each day. May shower starting three days after surgery. Please use a clean towel to pat the incision dry following showers. Continue to use ice for pain and swelling after surgery. Do not use any lotions or creams on the incision until instructed by your surgeon.  Wear both TED hose on both legs during the day every day for three weeks, but may remove the TED hose at night at home.  Postoperative Constipation Protocol  Constipation - defined medically as fewer than three stools per week and severe constipation as less than one stool per week.  One of the most common issues patients have following surgery is constipation.  Even if you have a regular bowel pattern at home, your normal regimen is likely to be disrupted due to multiple reasons following surgery.  Combination of anesthesia, postoperative narcotics, change in appetite and fluid intake all can affect your bowels.  In order to avoid complications following surgery, here  are some recommendations in order to help you during your recovery period.  Colace (docusate) - Pick up an over-the-counter form of Colace or another stool softener and take twice a day as long as you are requiring postoperative pain medications.  Take with a full glass of water daily.  If you experience loose stools or diarrhea, hold the colace  until you stool forms back up.  If your symptoms do not get better within 1 week or if they get worse, check with your doctor.  Dulcolax (bisacodyl) - Pick up over-the-counter and take as directed by the product packaging as needed to assist with the movement of your bowels.  Take with a full glass of water.  Use this product as needed if not relieved by Colace only.   MiraLax (polyethylene glycol) - Pick up over-the-counter to have on hand.  MiraLax is a solution that will increase the amount of water in your bowels to assist with bowel movements.  Take as directed and can mix with a glass of water, juice, soda, coffee, or tea.  Take if you go more than two days without a movement. Do not use MiraLax more than once per day. Call your doctor if you are still constipated or irregular after using this medication for 7 days in a row.  If you continue to have problems with postoperative constipation, please contact the office for further assistance and recommendations.  If you experience "the worst abdominal pain ever" or develop nausea or vomiting, please contact the office immediatly for further recommendations for treatment.   Do not put a pillow under the knee. Place it under the heel.   Complete by:  As directed    Do not sit on low chairs, stoools or toilet seats, as it may be difficult to get up from low surfaces   Complete by:  As directed    Driving restrictions   Complete by:  As directed    No driving until released by the physician.   Increase activity slowly as tolerated   Complete by:  As directed    Lifting restrictions   Complete by:  As directed    No lifting until released by the physician.   Patient may shower   Complete by:  As directed    You may shower without a dressing once there is no drainage.  Do not wash over the wound.  If drainage remains, do not shower until drainage stops.   TED hose   Complete by:  As directed    Use stockings (TED hose) for 3 weeks on both  leg(s).  You may remove them at night for sleeping.   Weight bearing as tolerated   Complete by:  As directed    Laterality:  right   Extremity:  Lower     Allergies as of 01/21/2017   No Known Allergies     Medication List    STOP taking these medications   cholecalciferol 1000 units tablet Commonly known as:  VITAMIN D     TAKE these medications   atenolol 50 MG tablet Commonly known as:  TENORMIN Take 50 mg by mouth daily.   atorvastatin 80 MG tablet Commonly known as:  LIPITOR Take 80 mg by mouth daily.   cetirizine 10 MG tablet Commonly known as:  ZYRTEC Take 10 mg by mouth daily.   ezetimibe 10 MG tablet Commonly known as:  ZETIA Take 10 mg by mouth daily.   lisinopril 20 MG tablet Commonly known  as:  PRINIVIL,ZESTRIL Take 20 mg by mouth daily.   oxyCODONE 5 MG immediate release tablet Commonly known as:  Oxy IR/ROXICODONE Take 1-2 tablets (5-10 mg total) by mouth every 4 (four) hours as needed for severe pain.   rivaroxaban 10 MG Tabs tablet Commonly known as:  XARELTO Take 1 tablet (10 mg total) by mouth daily with breakfast. Take Xarelto for two and a half more weeks following discharge from the hospital, then discontinue Xarelto. Once the patient has completed the blood thinner regimen, then take a Baby 81 mg Aspirin daily for three more weeks. Start taking on:  01/22/2017   tiZANidine 4 MG tablet Commonly known as:  ZANAFLEX Take 1 tablet (4 mg total) by mouth 3 (three) times daily.   Topiramate ER 100 MG Cp24 Commonly known as:  TROKENDI XR Take 1 capsule by mouth daily.   traMADol 50 MG tablet Commonly known as:  ULTRAM Take 1-2 tablets (50-100 mg total) by mouth every 6 (six) hours as needed for moderate pain (for moderate pain unrelieved by 86m oxyIR).            Discharge Care Instructions  (From admission, onward)        Start     Ordered   01/21/17 0000  Weight bearing as tolerated    Question Answer Comment  Laterality right    Extremity Lower      01/21/17 2141   01/21/17 0000  Change dressing    Comments:  Change dressing daily with sterile 4 x 4 inch gauze dressing and apply TED hose. Do not submerge the incision under water.   01/21/17 2141     Follow-up Information    [facility]   [facility]  [place]  [place] Contact information: [phone]        AGaynelle Arabian MD. Schedule an appointment as soon as possible for a visit on 02/04/2017.   Specialty:  Orthopedic Surgery Contact information: [phone]          Signed: DArlee Muslim PA-C Orthopaedic Surgery 01/21/2017, 9:42 PM

## 2017-01-22 LAB — CBC
HCT: 29.8 % — ABNORMAL LOW (ref 36.0–46.0)
Hemoglobin: 9.9 g/dL — ABNORMAL LOW (ref 12.0–15.0)
MCH: 29.5 pg (ref 26.0–34.0)
MCHC: 33.2 g/dL (ref 30.0–36.0)
MCV: 88.7 fL (ref 78.0–100.0)
PLATELETS: 130 10*3/uL — AB (ref 150–400)
RBC: 3.36 MIL/uL — ABNORMAL LOW (ref 3.87–5.11)
RDW: 13.1 % (ref 11.5–15.5)
WBC: 7.3 10*3/uL (ref 4.0–10.5)

## 2017-01-22 LAB — BASIC METABOLIC PANEL
ANION GAP: 5 (ref 5–15)
BUN: 20 mg/dL (ref 6–20)
CALCIUM: 9.1 mg/dL (ref 8.9–10.3)
CO2: 23 mmol/L (ref 22–32)
CREATININE: 0.87 mg/dL (ref 0.44–1.00)
Chloride: 109 mmol/L (ref 101–111)
Glucose, Bld: 108 mg/dL — ABNORMAL HIGH (ref 65–99)
Potassium: 3.7 mmol/L (ref 3.5–5.1)
SODIUM: 137 mmol/L (ref 135–145)

## 2017-01-22 NOTE — Progress Notes (Signed)
Physical Therapy Treatment Patient Details Name: Carol Levy MRN: [id number] DOB: [birth year] Today's Date: 01/22/2017    History of Present Illness s/p R TKA; Hx: essential tremors    PT Comments    POD # 2 Spouse present for "hands on" instruction on all mobility.  Practiced stairs backward due to no rails, amb in hallway with walker and performed some TKR TE's following HEP handout.  Also instructed on use of ICE.     Follow Up Recommendations  Home health PT;DC plan and follow up therapy as arranged by surgeon     Equipment Recommendations  None recommended by PT    Recommendations for Other Services       Precautions / Restrictions Precautions Precautions: Fall;Knee Precaution Comments: instructed on KI use for amb and stairs  Required Braces or Orthoses: Knee Immobilizer - Right Knee Immobilizer - Right: Discontinue once straight leg raise with < 10 degree lag Restrictions Weight Bearing Restrictions: No Other Position/Activity Restrictions: WBAT    Mobility  Bed Mobility Overal bed mobility: Needs Assistance Bed Mobility: Supine to Sit     Supine to sit: Min assist     General bed mobility comments: assist R LE off bed    Instructed spouse "hands on" proper tech   Transfers Overall transfer level: Needs assistance Equipment used: Rolling walker (2 wheeled) Transfers: Sit to/from Stand Sit to Stand: Min guard;Supervision         General transfer comment: 25% VC's on proper hand placement esp with stand to sit and safety with turns  Ambulation/Gait Ambulation/Gait assistance: Supervision;Min guard Ambulation Distance (Feet): 55 Feet Assistive device: Rolling walker (2 wheeled) Gait Pattern/deviations: Step-to pattern;Decreased stance time - right Gait velocity: decreased   General Gait Details: 25% cues for sequence and RW position/safety   Stairs Stairs: Yes   Stair Management: No rails;Step to pattern;Backwards;With walker Number of  Stairs: 4 General stair comments: with spouse "hands on" instructed on proper tech   Wheelchair Mobility    Modified Rankin (Stroke Patients Only)       Balance                                            Cognition Arousal/Alertness: Awake/alert Behavior During Therapy: WFL for tasks assessed/performed Overall Cognitive Status: Within Functional Limits for tasks assessed                                        Exercises   Total Knee Replacement TE's 10 reps B LE ankle pumps 10 reps towel squeezes 10 reps knee presses 10 reps heel slides  10 reps SAQ's 10 reps SLR's 10 reps ABD Followed by ICE     General Comments        Pertinent Vitals/Pain Pain Assessment: 0-10 Pain Score: 7  Pain Location: right knee Pain Descriptors / Indicators: Sore;Operative site guarding;Grimacing Pain Intervention(s): Monitored during session;Repositioned;Premedicated before session;Ice applied    Home Living                      Prior Function            PT Goals (current goals can now be found in the care plan section) Progress towards PT goals: Progressing toward goals    Frequency  7X/week      PT Plan Current plan remains appropriate    Co-evaluation              AM-PAC PT "6 Clicks" Daily Activity  Outcome Measure  Difficulty turning over in bed (including adjusting bedclothes, sheets and blankets)?: A Little Difficulty moving from lying on back to sitting on the side of the bed? : A Little Difficulty sitting down on and standing up from a chair with arms (e.g., wheelchair, bedside commode, etc,.)?: A Little Help needed moving to and from a bed to chair (including a wheelchair)?: A Little Help needed walking in hospital room?: A Little Help needed climbing 3-5 steps with a railing? : A Little 6 Click Score: 18    End of Session Equipment Utilized During Treatment: Gait belt Activity Tolerance: Patient  tolerated treatment well Patient left: in chair;with call bell/phone within reach;with family/visitor present Nurse Communication: (pt ready for D/C to home) PT Visit Diagnosis: Difficulty in walking, not elsewhere classified (R26.2);Pain Pain - Right/Left: Right Pain - part of body: Knee     Time: 0017-4944 PT Time Calculation (min) (ACUTE ONLY): 30 min  Charges:  $Gait Training: 8-22 mins $Therapeutic Exercise: 8-22 mins                    G Codes:       Rica Koyanagi  PTA [facility]  Acute  Rehab Pager      [phone]

## 2017-03-20 NOTE — Progress Notes (Signed)
Subjective:   Carol Levy was seen in consultation in the movement disorder clinic at the request of Carol Ada, MD.  The evaluation is for tremor.  Pt is R hand dominant.  Pt notes tremor in both hands and she will sometimes note it in her mouth (a quiver).  Her mother has same.  Pt states that hand tremor started in her 30's.  She went to a doctor then and was given inderal but lost it before she even tried it.  Pt states that she is on atenolol for BP and has been on that for years.  Was told that she could try and increase the dose but she didn't do that.  BP/pulse already low and worries about going up on it further.  If she is carrying a plate at a buffet, her plate will shake.     Affected by caffeine:  No.  (drinks 1 cup coffee per day) Affected by alcohol:  Yes.   (3 glasses wine per week) Affected by stress:  Yes.   Affected by fatigue:  No. Spills soup if on spoon:  Yes.   (rarely gets soup because of this) Spills glass of liquid if full:  Yes.  (holds glass with both hands because of this) Affects ADL's (tying shoes, brushing teeth, etc):  No.  Some trouble sewing, threading a needle  02/07/14 update:  The patient returns today for follow-up.  We started the patient on primidone last visit.  She is still on atenolol, but that is primarily for her blood pressure.  Today, the patient states that the medication (primidone) makes her feel heavy headed and if she drinks alcohol, she gets "tipsy" quickly (after 2 drinks).  Therefore she doesn't want to go up on it to try and get better tremor control.    05/09/14 update:  Pt is f/u today.  Started topamax last visit for tremor and is now on 100 mg daily.  She states that it has helped tremor but she is having memory issues especially word finding troubles.  She has noted that carbonated beverages taste poorly.  Has lost appetite.    09/12/14 update:  Pt is f/u today.  Is only on topamax once a day now as she forgets the end of the  day one.  She is off of the primidone as she thinks that it caused dreams.  Had labs around 02/2014 from PCP.  Overall, thinks that tremor is about the same.  Doesn't want/need to change med  03/13/15 update:  The patient is following up today regarding her essential tremor.  She is only on Topamax, 50 mg daily.  Tremor is "annoying but not as bad as it was."   Did contact her primary care physician since last visit and, lab work, but the labs are about 69-year-old now.  They were from 03/09/2014.  Her AST was 22, ALT 36 and alkaline phosphatase 76.  Her BUN was 20 and creatinine 0.76.   The remainder of her chemistry was normal.  States that she did have more recent labs in December.    09/11/15 update:  The patient follows up today for her essential tremor.  She remains on Topamax, 50 mg daily.  She feels that she is doing about the same and has not deteriorated but tremor is becoming more of a problem.  Having some migraine with hx of similar.  Been on higher dose of topamax in past with cog change.  06/19/16 update:  Pt f/u  today.  Last saw her in 08/2015.  Changed her to trokendi last visit and increased dose to 100 mg.  States that she is doing well and sometimes will forget things.  She is not sure if it is better than the topamax.  03/21/17 update: Patient is seen in follow-up.  She is on Trokendi, 100 mg daily.  She is doing well.  She states tremor has been stable.  Records were reviewed since last visit.  She had a right total knee replacement at the end of November.  Has lost weight.  Cutting back on food.  Eating candy bar in the AM and nothing then until dinner.    Outside reports reviewed: historical medical records and lab reports.  No Known Allergies  Outpatient Encounter Medications as of 03/21/2017  Medication Sig  . atenolol (TENORMIN) 50 MG tablet Take 50 mg by mouth daily.  Marland Kitchen atorvastatin (LIPITOR) 80 MG tablet Take 80 mg by mouth daily.  . cetirizine (ZYRTEC) 10 MG tablet Take 10 mg  by mouth daily.  Marland Kitchen ezetimibe (ZETIA) 10 MG tablet Take 10 mg by mouth daily.  Marland Kitchen gabapentin (NEURONTIN) 300 MG capsule Take 300 mg by mouth 2 (two) times daily.  Marland Kitchen lisinopril (PRINIVIL,ZESTRIL) 20 MG tablet Take 20 mg by mouth daily.  . Topiramate ER (TROKENDI XR) 100 MG CP24 Take 1 capsule by mouth daily.  . traMADol (ULTRAM) 50 MG tablet Take 1-2 tablets (50-100 mg total) by mouth every 6 (six) hours as needed for moderate pain (for moderate pain unrelieved by 5mg  oxyIR).  . [DISCONTINUED] oxyCODONE (OXY IR/ROXICODONE) 5 MG immediate release tablet Take 1-2 tablets (5-10 mg total) by mouth every 4 (four) hours as needed for severe pain.  . [DISCONTINUED] rivaroxaban (XARELTO) 10 MG TABS tablet Take 1 tablet (10 mg total) by mouth daily with breakfast. Take Xarelto for two and a half more weeks following discharge from the hospital, then discontinue Xarelto. Once the patient has completed the blood thinner regimen, then take a Baby 81 mg Aspirin daily for three more weeks.   No facility-administered encounter medications on file as of 03/21/2017.     Past Medical History:  Diagnosis Date  . Anemia    hx of   . Arthritis   . Breast cancer ([place])    L breast IDC in 2004, R breast IDC with DCIS in 2008  . Essential tremor   . Family history of adverse reaction to anesthesia    mother- mother had reaction to some anesthesia years ago per patient   . History of chemotherapy    4 cycles TCH with Herceptin for one year in 2008  . Hypercholesterolemia   . Hypertension   . S/P radiation therapy    12/01/02 to 01/19/03  . Seasonal allergies     Past Surgical History:  Procedure Laterality Date  . BREAST LUMPECTOMY  10/18/02   left breast  . BREAST RECONSTRUCTION Right   . DILATION AND CURETTAGE OF UTERUS    . full facial surgery - facelift     . MASTECTOMY  09/04/06  . TOTAL KNEE ARTHROPLASTY Right 01/20/2017   Procedure: RIGHT TOTAL KNEE ARTHROPLASTY;  Surgeon: Gaynelle Arabian, MD;   Location: [facility];  Service: Orthopedics;  Laterality: Right;  Adductor Block  . WISDOM TOOTH EXTRACTION      Social History   Socioeconomic History  . Marital status: Married    Spouse name: Not on file  . Number of children: Not on file  .  Years of education: Not on file  . Highest education level: Not on file  Social Needs  . Financial resource strain: Not on file  . Food insecurity - worry: Not on file  . Food insecurity - inability: Not on file  . Transportation needs - medical: Not on file  . Transportation needs - non-medical: Not on file  Occupational History  . Not on file  Tobacco Use  . Smoking status: Never Smoker  . Smokeless tobacco: Never Used  Substance and Sexual Activity  . Alcohol use: Yes    Alcohol/week: 0.0 oz    Comment: 3 glasses a week  . Drug use: No  . Sexual activity: No  Other Topics Concern  . Not on file  Social History Narrative  . Not on file    Family Status  Relation Name Status  . Mother  Alive       breast cancer, essential tremor  . Father  Deceased       melanoma  . Brother  Alive       healthy  . Son  Alive       healthy    Review of Systems  A complete 10 system ROS was obtained and was negative apart from what is mentioned.   Objective:   VITALS:   Vitals:   03/21/17 1103  BP: 124/78  Pulse: 78  SpO2: 97%  Weight: 138 lb (62.6 kg)  Height: 5\' 6"  (1.676 m)   Wt Readings from Last 3 Encounters:  03/21/17 138 lb (62.6 kg)  01/20/17 147 lb (66.7 kg)  01/14/17 147 lb (66.7 kg)     Gen:  Appears stated age and in NAD. HEENT:  Normocephalic, atraumatic. The mucous membranes are moist. The superficial temporal arteries are without ropiness or tenderness.   CV:  RRR Lungs:  CTAB Neck:  No bruits   NEUROLOGICAL:  Orientation:  The patient is alert and oriented x 3.   Cranial nerves: There is good facial symmetry.  Extraocular muscles are intact and visual fields are full to confrontational testing. Speech is  fluent and clear. Soft palate rises symmetrically and there is no tongue deviation. Hearing is intact to conversational tone. Tone: normal Sensation: Sensation is intact to light touch Coordination:  The patient has slight difficulty with RAMs in the LUE, esp with alteration of supination/pronation in the forearm Motor: Strength is 5/5 in the bilateral upper and lower extremities.  Shoulder shrug is equal bilaterally.  There is no pronator drift.  There are no fasciculations noted. Gait and Station: The patient is able to ambulate without difficulty.   MOVEMENT EXAM: Tremor:  There is mild intention tremor bilaterally.  Mild trouble with archimedes spirals bilaterally    Chemistry      Component Value Date/Time   NA 137 01/22/2017 0558   K 3.7 01/22/2017 0558   CL 109 01/22/2017 0558   CO2 23 01/22/2017 0558   BUN 20 01/22/2017 0558   CREATININE 0.87 01/22/2017 0558      Component Value Date/Time   CALCIUM 9.1 01/22/2017 0558   ALKPHOS 68 01/14/2017 1533   AST 32 01/14/2017 1533   ALT 31 01/14/2017 1533   BILITOT 0.8 01/14/2017 1533     Lab Results  Component Value Date   WBC 7.3 01/22/2017   HGB 9.9 (L) 01/22/2017   HCT 29.8 (L) 01/22/2017   MCV 88.7 01/22/2017   PLT 130 (L) 01/22/2017        Assessment/Plan:  1.   Essential Tremor and migraine  -She has had this for a very long time, since her early 56s.  In addition, her mother has the same.    -took self off primidone for ? Bad dreams  -continue trokendi  100 mg daily. Need to watch weight.  She is not eating properly and discussed that she needs to eat proper meals.  Discussed addition of protein supplements like ensure.   -I don't think that this tremor represents anything neurodegenerative but will watch for ET/PD as she does have some trouble with RAM's on the L and some probable gegenhalten vs true increased tone on the L.   2.  F/u 9 months.

## 2017-03-21 ENCOUNTER — Encounter: Payer: Self-pay | Admitting: Neurology

## 2017-03-21 ENCOUNTER — Ambulatory Visit (INDEPENDENT_AMBULATORY_CARE_PROVIDER_SITE_OTHER): Payer: Medicare Other | Admitting: Neurology

## 2017-03-21 VITALS — BP 124/78 | HR 78 | Ht 66.0 in | Wt 138.0 lb

## 2017-03-21 DIAGNOSIS — R634 Abnormal weight loss: Secondary | ICD-10-CM | POA: Diagnosis not present

## 2017-03-21 DIAGNOSIS — G250 Essential tremor: Secondary | ICD-10-CM | POA: Diagnosis not present

## 2017-04-15 ENCOUNTER — Other Ambulatory Visit: Payer: Self-pay | Admitting: Neurology

## 2017-05-15 ENCOUNTER — Other Ambulatory Visit: Payer: Self-pay | Admitting: Family Medicine

## 2017-05-15 DIAGNOSIS — Z1231 Encounter for screening mammogram for malignant neoplasm of breast: Secondary | ICD-10-CM

## 2017-06-04 ENCOUNTER — Ambulatory Visit
Admission: RE | Admit: 2017-06-04 | Discharge: 2017-06-04 | Disposition: A | Discharge status: Home / Self Care | Payer: Medicare Other | Source: Ambulatory Visit | Attending: Family Medicine | Admitting: Family Medicine

## 2017-06-04 DIAGNOSIS — Z1231 Encounter for screening mammogram for malignant neoplasm of breast: Secondary | ICD-10-CM

## 2017-11-21 NOTE — Progress Notes (Deleted)
Subjective:   Carol Levy was seen in consultation in the movement disorder clinic at the request of Carol Ada, MD.  The evaluation is for tremor.  Pt is R hand dominant.  Pt notes tremor in both hands and she will sometimes note it in her mouth (a quiver).  Her mother has same.  Pt states that hand tremor started in her 30's.  She went to a doctor then and was given inderal but lost it before she even tried it.  Pt states that she is on atenolol for BP and has been on that for years.  Was told that she could try and increase the dose but she didn't do that.  BP/pulse already low and worries about going up on it further.  If she is carrying a plate at a buffet, her plate will shake.     Affected by caffeine:  No.  (drinks 1 cup coffee per day) Affected by alcohol:  Yes.   (3 glasses wine per week) Affected by stress:  Yes.   Affected by fatigue:  No. Spills soup if on spoon:  Yes.   (rarely gets soup because of this) Spills glass of liquid if full:  Yes.  (holds glass with both hands because of this) Affects ADL's (tying shoes, brushing teeth, etc):  No.  Some trouble sewing, threading a needle  02/07/14 update:  The patient returns today for follow-up.  We started the patient on primidone last visit.  She is still on atenolol, but that is primarily for her blood pressure.  Today, the patient states that the medication (primidone) makes her feel heavy headed and if she drinks alcohol, she gets "tipsy" quickly (after 2 drinks).  Therefore she doesn't want to go up on it to try and get better tremor control.    05/09/14 update:  Pt is f/u today.  Started topamax last visit for tremor and is now on 100 mg daily.  She states that it has helped tremor but she is having memory issues especially word finding troubles.  She has noted that carbonated beverages taste poorly.  Has lost appetite.    09/12/14 update:  Pt is f/u today.  Is only on topamax once a day now as she forgets the end of the  day one.  She is off of the primidone as she thinks that it caused dreams.  Had labs around 02/2014 from PCP.  Overall, thinks that tremor is about the same.  Doesn't want/need to change med  03/13/15 update:  The patient is following up today regarding her essential tremor.  She is only on Topamax, 50 mg daily.  Tremor is "annoying but not as bad as it was."   Did contact her primary care physician since last visit and, lab work, but the labs are about 69-year-old now.  They were from 03/09/2014.  Her AST was 22, ALT 36 and alkaline phosphatase 76.  Her BUN was 20 and creatinine 0.76.   The remainder of her chemistry was normal.  States that she did have more recent labs in December.    09/11/15 update:  The patient follows up today for her essential tremor.  She remains on Topamax, 50 mg daily.  She feels that she is doing about the same and has not deteriorated but tremor is becoming more of a problem.  Having some migraine with hx of similar.  Been on higher dose of topamax in past with cog change.  06/19/16 update:  Pt f/u  today.  Last saw her in 08/2015.  Changed her to trokendi last visit and increased dose to 100 mg.  States that she is doing well and sometimes will forget things.  She is not sure if it is better than the topamax.  03/21/17 update: Patient is seen in follow-up.  She is on Trokendi, 100 mg daily.  She is doing well.  She states tremor has been stable.  Records were reviewed since last visit.  She had a right total knee replacement at the end of November.  Has lost weight.  Cutting back on food.  Eating candy bar in the AM and nothing then until dinner.    11/25/17 update: Patient is seen today in follow-up for tremor.  She is on Trokendi, 100 mg daily.  Last visit, she was having weight loss and we talked about the importance of diet and reports today that ***  Outside reports reviewed: historical medical records and lab reports.  No Known Allergies  Outpatient Encounter Medications  as of 11/25/2017  Medication Sig  . atenolol (TENORMIN) 50 MG tablet Take 50 mg by mouth daily.  Marland Kitchen atorvastatin (LIPITOR) 80 MG tablet Take 80 mg by mouth daily.  . cetirizine (ZYRTEC) 10 MG tablet Take 10 mg by mouth daily.  Marland Kitchen ezetimibe (ZETIA) 10 MG tablet Take 10 mg by mouth daily.  Marland Kitchen gabapentin (NEURONTIN) 300 MG capsule Take 300 mg by mouth 2 (two) times daily.  Marland Kitchen lisinopril (PRINIVIL,ZESTRIL) 20 MG tablet Take 20 mg by mouth daily.  . traMADol (ULTRAM) 50 MG tablet Take 1-2 tablets (50-100 mg total) by mouth every 6 (six) hours as needed for moderate pain (for moderate pain unrelieved by 5mg  oxyIR).  Marland Kitchen TROKENDI XR 100 MG CP24 TAKE 1 CAPSULE BY MOUTH ONCE DAILY   No facility-administered encounter medications on file as of 11/25/2017.     Past Medical History:  Diagnosis Date  . Anemia    hx of   . Arthritis   . Breast cancer ([place])    L breast IDC in 2004, R breast IDC with DCIS in 2008  . Essential tremor   . Family history of adverse reaction to anesthesia    mother- mother had reaction to some anesthesia years ago per patient   . History of chemotherapy    4 cycles TCH with Herceptin for one year in 2008  . Hypercholesterolemia   . Hypertension   . S/P radiation therapy    12/01/02 to 01/19/03  . Seasonal allergies     Past Surgical History:  Procedure Laterality Date  . BREAST LUMPECTOMY  10/18/02   left breast  . BREAST RECONSTRUCTION Right   . DILATION AND CURETTAGE OF UTERUS    . full facial surgery - facelift     . MASTECTOMY  09/04/06  . TOTAL KNEE ARTHROPLASTY Right 01/20/2017   Procedure: RIGHT TOTAL KNEE ARTHROPLASTY;  Surgeon: Gaynelle Arabian, MD;  Location: [facility];  Service: Orthopedics;  Laterality: Right;  Adductor Block  . WISDOM TOOTH EXTRACTION      Social History   Socioeconomic History  . Marital status: Married    Spouse name: Not on file  . Number of children: Not on file  . Years of education: Not on file  . Highest education level: Not  on file  Occupational History  . Not on file  Social Needs  . Financial resource strain: Not on file  . Food insecurity:    Worry: Not on file  Inability: Not on file  . Transportation needs:    Medical: Not on file    Non-medical: Not on file  Tobacco Use  . Smoking status: Never Smoker  . Smokeless tobacco: Never Used  Substance and Sexual Activity  . Alcohol use: Yes    Alcohol/week: 0.0 standard drinks    Comment: 3 glasses a week  . Drug use: No  . Sexual activity: Never  Lifestyle  . Physical activity:    Days per week: Not on file    Minutes per session: Not on file  . Stress: Not on file  Relationships  . Social connections:    Talks on phone: Not on file    Gets together: Not on file    Attends religious service: Not on file    Active member of club or organization: Not on file    Attends meetings of clubs or organizations: Not on file    Relationship status: Not on file  . Intimate partner violence:    Fear of current or ex partner: Not on file    Emotionally abused: Not on file    Physically abused: Not on file    Forced sexual activity: Not on file  Other Topics Concern  . Not on file  Social History Narrative  . Not on file    Family Status  Relation Name Status  . Mother  Alive       breast cancer, essential tremor  . Father  Deceased       melanoma  . Brother  Alive       healthy  . Son  Alive       healthy    Review of Systems  A complete 10 system ROS was obtained and was negative apart from what is mentioned.   Objective:   VITALS:   There were no vitals filed for this visit. Wt Readings from Last 3 Encounters:  03/21/17 138 lb (62.6 kg)  01/20/17 147 lb (66.7 kg)  01/14/17 147 lb (66.7 kg)     Gen:  Appears stated age and in NAD. HEENT:  Normocephalic, atraumatic. The mucous membranes are moist. The superficial temporal arteries are without ropiness or tenderness.   CV:  RRR Lungs:  CTAB Neck:  No  bruits   NEUROLOGICAL:  Orientation:  The patient is alert and oriented x 3.   Cranial nerves: There is good facial symmetry.  Extraocular muscles are intact and visual fields are full to confrontational testing. Speech is fluent and clear. Soft palate rises symmetrically and there is no tongue deviation. Hearing is intact to conversational tone. Tone: normal Sensation: Sensation is intact to light touch Coordination:  The patient has slight difficulty with RAMs in the LUE, esp with alteration of supination/pronation in the forearm Motor: Strength is 5/5 in the bilateral upper and lower extremities.  Shoulder shrug is equal bilaterally.  There is no pronator drift.  There are no fasciculations noted. Gait and Station: The patient is able to ambulate without difficulty.   MOVEMENT EXAM: Tremor:  There is mild intention tremor bilaterally.  Mild trouble with archimedes spirals bilaterally    Chemistry      Component Value Date/Time   NA 137 01/22/2017 0558   K 3.7 01/22/2017 0558   CL 109 01/22/2017 0558   CO2 23 01/22/2017 0558   BUN 20 01/22/2017 0558   CREATININE 0.87 01/22/2017 0558      Component Value Date/Time   CALCIUM 9.1 01/22/2017 0558  ALKPHOS 68 01/14/2017 1533   AST 32 01/14/2017 1533   ALT 31 01/14/2017 1533   BILITOT 0.8 01/14/2017 1533     Lab Results  Component Value Date   WBC 7.3 01/22/2017   HGB 9.9 (L) 01/22/2017   HCT 29.8 (L) 01/22/2017   MCV 88.7 01/22/2017   PLT 130 (L) 01/22/2017        Assessment/Plan:   1.   Essential Tremor and migraine  -She has had this for a very long time, since her early 34s.  In addition, her mother has the same.    -took self off primidone for ? Bad dreams  -continue trokendi  100 mg daily. Need to watch weight.  She is not eating properly and discussed that she needs to eat proper meals.  Discussed addition of protein supplements like ensure.   -I don't think that this tremor represents anything neurodegenerative  but will watch for ET/PD as she does have some trouble with RAM's on the L and some probable gegenhalten vs true increased tone on the L.   2.  F/u 9 months.

## 2017-11-25 ENCOUNTER — Ambulatory Visit: Payer: Medicare Other | Admitting: Neurology

## 2017-11-25 ENCOUNTER — Encounter: Payer: Self-pay | Admitting: Neurology

## 2017-11-25 DIAGNOSIS — Z029 Encounter for administrative examinations, unspecified: Secondary | ICD-10-CM

## 2017-12-22 ENCOUNTER — Ambulatory Visit: Payer: Medicare Other | Admitting: Neurology

## 2017-12-29 NOTE — Progress Notes (Signed)
Subjective:   Carol Levy was seen in consultation in the movement disorder clinic at the request of Carol Ada, MD.  The evaluation is for tremor.  Pt is R hand dominant.  Pt notes tremor in both hands and she will sometimes note it in her mouth (a quiver).  Her mother has same.  Pt states that hand tremor started in her 30's.  She went to a doctor then and was given inderal but lost it before she even tried it.  Pt states that she is on atenolol for BP and has been on that for years.  Was told that she could try and increase the dose but she didn't do that.  BP/pulse already low and worries about going up on it further.  If she is carrying a plate at a buffet, her plate will shake.     Affected by caffeine:  No.  (drinks 1 cup coffee per day) Affected by alcohol:  Yes.   (3 glasses wine per week) Affected by stress:  Yes.   Affected by fatigue:  No. Spills soup if on spoon:  Yes.   (rarely gets soup because of this) Spills glass of liquid if full:  Yes.  (holds glass with both hands because of this) Affects ADL's (tying shoes, brushing teeth, etc):  No.  Some trouble sewing, threading a needle  02/07/14 update:  The patient returns today for follow-up.  We started the patient on primidone last visit.  She is still on atenolol, but that is primarily for her blood pressure.  Today, the patient states that the medication (primidone) makes her feel heavy headed and if she drinks alcohol, she gets "tipsy" quickly (after 2 drinks).  Therefore she doesn't want to go up on it to try and get better tremor control.    05/09/14 update:  Pt is f/u today.  Started topamax last visit for tremor and is now on 100 mg daily.  She states that it has helped tremor but she is having memory issues especially word finding troubles.  She has noted that carbonated beverages taste poorly.  Has lost appetite.    09/12/14 update:  Pt is f/u today.  Is only on topamax once a day now as she forgets the end of the  day one.  She is off of the primidone as she thinks that it caused dreams.  Had labs around 02/2014 from PCP.  Overall, thinks that tremor is about the same.  Doesn't want/need to change med  03/13/15 update:  The patient is following up today regarding her essential tremor.  She is only on Topamax, 50 mg daily.  Tremor is "annoying but not as bad as it was."   Did contact her primary care physician since last visit and, lab work, but the labs are about 79-year-old now.  They were from 03/09/2014.  Her AST was 22, ALT 36 and alkaline phosphatase 76.  Her BUN was 20 and creatinine 0.76.   The remainder of her chemistry was normal.  States that she did have more recent labs in December.    09/11/15 update:  The patient follows up today for her essential tremor.  She remains on Topamax, 50 mg daily.  She feels that she is doing about the same and has not deteriorated but tremor is becoming more of a problem.  Having some migraine with hx of similar.  Been on higher dose of topamax in past with cog change.  06/19/16 update:  Pt f/u  today.  Last saw her in 08/2015.  Changed her to trokendi last visit and increased dose to 100 mg.  States that she is doing well and sometimes will forget things.  She is not sure if it is better than the topamax.  03/21/17 update: Patient is seen in follow-up.  She is on Trokendi, 100 mg daily.  She is doing well.  She states tremor has been stable.  Records were reviewed since last visit.  She had a right total knee replacement at the end of November.  Has lost weight.  Cutting back on food.  Eating candy bar in the AM and nothing then until dinner.    12/31/17 update: Patient is seen today in follow-up for tremor.  She is on Trokendi, 100 mg daily.  Reports tremor has been stable.  Reports that appetite/weight has been stable (few pounds up today).  Stress has been increased as her mother fell at the end of sept and broke her femur and pt is caregiving.  Mother is living with her.   Mother is 71 y/o.  Outside reports reviewed: historical medical records and lab reports.  No Known Allergies  Outpatient Encounter Medications as of 12/31/2017  Medication Sig  . atenolol (TENORMIN) 50 MG tablet Take 50 mg by mouth daily.  Marland Kitchen atorvastatin (LIPITOR) 80 MG tablet Take 80 mg by mouth daily.  . cetirizine (ZYRTEC) 10 MG tablet Take 10 mg by mouth daily.  Marland Kitchen ezetimibe (ZETIA) 10 MG tablet Take 10 mg by mouth daily.  Marland Kitchen gabapentin (NEURONTIN) 300 MG capsule Take 300 mg by mouth daily.   Marland Kitchen lisinopril (PRINIVIL,ZESTRIL) 20 MG tablet Take 20 mg by mouth daily.  . traMADol (ULTRAM) 50 MG tablet Take 1-2 tablets (50-100 mg total) by mouth every 6 (six) hours as needed for moderate pain (for moderate pain unrelieved by 5mg  oxyIR).  Marland Kitchen TROKENDI XR 100 MG CP24 TAKE 1 CAPSULE BY MOUTH ONCE DAILY   No facility-administered encounter medications on file as of 12/31/2017.     Past Medical History:  Diagnosis Date  . Anemia    hx of   . Arthritis   . Breast cancer ([place])    L breast IDC in 2004, R breast IDC with DCIS in 2008  . Essential tremor   . Family history of adverse reaction to anesthesia    mother- mother had reaction to some anesthesia years ago per patient   . History of chemotherapy    4 cycles TCH with Herceptin for one year in 2008  . Hypercholesterolemia   . Hypertension   . S/P radiation therapy    12/01/02 to 01/19/03  . Seasonal allergies     Past Surgical History:  Procedure Laterality Date  . BREAST LUMPECTOMY  10/18/02   left breast  . BREAST RECONSTRUCTION Right   . DILATION AND CURETTAGE OF UTERUS    . full facial surgery - facelift     . MASTECTOMY  09/04/06  . TOTAL KNEE ARTHROPLASTY Right 01/20/2017   Procedure: RIGHT TOTAL KNEE ARTHROPLASTY;  Surgeon: Gaynelle Arabian, MD;  Location: [facility];  Service: Orthopedics;  Laterality: Right;  Adductor Block  . WISDOM TOOTH EXTRACTION      Social History   Socioeconomic History  . Marital status: Married     Spouse name: Not on file  . Number of children: Not on file  . Years of education: Not on file  . Highest education level: Not on file  Occupational History  . Not  on file  Social Needs  . Financial resource strain: Not on file  . Food insecurity:    Worry: Not on file    Inability: Not on file  . Transportation needs:    Medical: Not on file    Non-medical: Not on file  Tobacco Use  . Smoking status: Never Smoker  . Smokeless tobacco: Never Used  Substance and Sexual Activity  . Alcohol use: Yes    Alcohol/week: 0.0 standard drinks    Comment: 3 glasses a week  . Drug use: No  . Sexual activity: Never  Lifestyle  . Physical activity:    Days per week: Not on file    Minutes per session: Not on file  . Stress: Not on file  Relationships  . Social connections:    Talks on phone: Not on file    Gets together: Not on file    Attends religious service: Not on file    Active member of club or organization: Not on file    Attends meetings of clubs or organizations: Not on file    Relationship status: Not on file  . Intimate partner violence:    Fear of current or ex partner: Not on file    Emotionally abused: Not on file    Physically abused: Not on file    Forced sexual activity: Not on file  Other Topics Concern  . Not on file  Social History Narrative  . Not on file    Family Status  Relation Name Status  . Mother  Alive       breast cancer, essential tremor  . Father  Deceased       melanoma  . Brother  Alive       healthy  . Son  Alive       healthy    Review of Systems  A complete 10 system ROS was obtained and was negative apart from what is mentioned.   Objective:   VITALS:   Vitals:   12/31/17 1059  BP: 138/64  Pulse: 64  SpO2: 99%  Weight: 142 lb (64.4 kg)  Height: 5\' 6"  (1.676 m)   Wt Readings from Last 3 Encounters:  12/31/17 142 lb (64.4 kg)  03/21/17 138 lb (62.6 kg)  01/20/17 147 lb (66.7 kg)     Gen:  Appears stated age and  in NAD. HEENT:  Normocephalic, atraumatic. The mucous membranes are moist. The superficial temporal arteries are without ropiness or tenderness.   CV:  RRR Lungs:  CTAB Neck:  No bruits   NEUROLOGICAL:  Orientation:  The patient is alert and oriented x 3.   Cranial nerves: There is good facial symmetry.  Extraocular muscles are intact and visual fields are full to confrontational testing. Speech is fluent and clear. Soft palate rises symmetrically and there is no tongue deviation. Hearing is intact to conversational tone. Tone: normal Sensation: Sensation is intact to light touch Coordination:  The patient has slight difficulty with RAMs in the LUE, esp with alteration of supination/pronation in the forearm Motor: Strength is 5/5 in the bilateral upper and lower extremities.  Shoulder shrug is equal bilaterally.  There is no pronator drift.  There are no fasciculations noted. Gait and Station: The patient is able to ambulate without difficulty.   MOVEMENT EXAM: Tremor:  There is mild intention tremor bilaterally.  Archimedes spirals are drawn fairly well.  Mild trouble on the left.    Chemistry      Component Value  Date/Time   NA 137 01/22/2017 0558   K 3.7 01/22/2017 0558   CL 109 01/22/2017 0558   CO2 23 01/22/2017 0558   BUN 20 01/22/2017 0558   CREATININE 0.87 01/22/2017 0558      Component Value Date/Time   CALCIUM 9.1 01/22/2017 0558   ALKPHOS 68 01/14/2017 1533   AST 32 01/14/2017 1533   ALT 31 01/14/2017 1533   BILITOT 0.8 01/14/2017 1533     Lab Results  Component Value Date   WBC 7.3 01/22/2017   HGB 9.9 (L) 01/22/2017   HCT 29.8 (L) 01/22/2017   MCV 88.7 01/22/2017   PLT 130 (L) 01/22/2017        Assessment/Plan:   1.   Essential Tremor and migraine  -She has had this for a very long time, since her early 49s.  In addition, her mother has the same.    -took self off primidone for ? Bad dreams  -continue trokendi  100 mg daily.  Weight is not falling  off.  We will continue to watch.  -I don't think that this tremor represents anything neurodegenerative but will watch for ET/PD as she does have some trouble with RAM's on the L and some probable gegenhalten vs true increased tone on the L.   2.  Follow-up 1 year.

## 2017-12-31 ENCOUNTER — Encounter: Payer: Self-pay | Admitting: Neurology

## 2017-12-31 ENCOUNTER — Ambulatory Visit (INDEPENDENT_AMBULATORY_CARE_PROVIDER_SITE_OTHER): Payer: Medicare Other | Admitting: Neurology

## 2017-12-31 VITALS — BP 138/64 | HR 64 | Ht 66.0 in | Wt 142.0 lb

## 2017-12-31 DIAGNOSIS — G250 Essential tremor: Secondary | ICD-10-CM | POA: Diagnosis not present

## 2017-12-31 NOTE — Patient Instructions (Signed)
Let me know when you need a refill.  I will see you in a year!

## 2018-03-13 ENCOUNTER — Other Ambulatory Visit: Payer: Self-pay | Admitting: Neurology

## 2018-07-28 ENCOUNTER — Other Ambulatory Visit: Payer: Self-pay | Admitting: Family Medicine

## 2018-07-28 DIAGNOSIS — Z1231 Encounter for screening mammogram for malignant neoplasm of breast: Secondary | ICD-10-CM

## 2018-09-15 ENCOUNTER — Other Ambulatory Visit: Payer: Self-pay

## 2018-09-15 ENCOUNTER — Ambulatory Visit
Admission: RE | Admit: 2018-09-15 | Discharge: 2018-09-15 | Disposition: A | Discharge status: Home / Self Care | Payer: Medicare Other | Source: Ambulatory Visit | Attending: Family Medicine | Admitting: Family Medicine

## 2018-09-15 DIAGNOSIS — Z1231 Encounter for screening mammogram for malignant neoplasm of breast: Secondary | ICD-10-CM

## 2018-10-30 ENCOUNTER — Other Ambulatory Visit: Payer: Self-pay | Admitting: Neurology

## 2019-01-04 NOTE — Progress Notes (Signed)
Carol Levy was seen today in follow up for Essential tremor.  Pt is currently on Trokendi, 100 mg daily.  She takes this for both tremor and headache.  She has no headaches.  Tremor has been the same/stable.  Pt denies falls.  Pt denies lightheadedness, near syncope.  No hallucinations.  Mood has been good.   PREVIOUS MEDICATIONS:  primidone (dreams)  ALLERGIES:  No Known Allergies  CURRENT MEDICATIONS:  Outpatient Encounter Medications as of 01/05/2019  Medication Sig  . atenolol (TENORMIN) 50 MG tablet Take 50 mg by mouth daily.  Marland Kitchen atorvastatin (LIPITOR) 80 MG tablet Take 80 mg by mouth daily.  . cetirizine (ZYRTEC) 10 MG tablet Take 10 mg by mouth daily.  Marland Kitchen ezetimibe (ZETIA) 10 MG tablet Take 10 mg by mouth daily.  Marland Kitchen gabapentin (NEURONTIN) 300 MG capsule Take 300 mg by mouth daily.   Marland Kitchen lisinopril (PRINIVIL,ZESTRIL) 20 MG tablet Take 20 mg by mouth daily.  . traMADol (ULTRAM) 50 MG tablet Take 1-2 tablets (50-100 mg total) by mouth every 6 (six) hours as needed for moderate pain (for moderate pain unrelieved by 5mg  oxyIR).  Marland Kitchen TROKENDI XR 100 MG CP24 Take 1 capsule by mouth once daily   No facility-administered encounter medications on file as of 01/05/2019.     PAST MEDICAL HISTORY:   Past Medical History:  Diagnosis Date  . Anemia    hx of   . Arthritis   . Breast cancer ([place])    L breast IDC in 2004, R breast IDC with DCIS in 2008  . Essential tremor   . Family history of adverse reaction to anesthesia    mother- mother had reaction to some anesthesia years ago per patient   . History of chemotherapy    4 cycles TCH with Herceptin for one year in 2008  . Hypercholesterolemia   . Hypertension   . S/P radiation therapy    12/01/02 to 01/19/03  . Seasonal allergies     PAST SURGICAL HISTORY:   Past Surgical History:  Procedure Laterality Date  . BREAST LUMPECTOMY  10/18/02   left breast  . BREAST RECONSTRUCTION Right   . DILATION AND CURETTAGE OF  UTERUS    . full facial surgery - facelift     . MASTECTOMY  09/04/06  . TOTAL KNEE ARTHROPLASTY Right 01/20/2017   Procedure: RIGHT TOTAL KNEE ARTHROPLASTY;  Surgeon: Gaynelle Arabian, MD;  Location: [facility];  Service: Orthopedics;  Laterality: Right;  Adductor Block  . WISDOM TOOTH EXTRACTION      SOCIAL HISTORY:   Social History   Socioeconomic History  . Marital status: Married    Spouse name: Not on file  . Number of children: Not on file  . Years of education: Not on file  . Highest education level: Not on file  Occupational History  . Not on file  Social Needs  . Financial resource strain: Not on file  . Food insecurity    Worry: Not on file    Inability: Not on file  . Transportation needs    Medical: Not on file    Non-medical: Not on file  Tobacco Use  . Smoking status: Never Smoker  . Smokeless tobacco: Never Used  Substance and Sexual Activity  . Alcohol use: Yes    Alcohol/week: 0.0 standard drinks    Comment: 3 glasses a week  . Drug use: No  . Sexual activity: Never  Lifestyle  . Physical activity  Days per week: Not on file    Minutes per session: Not on file  . Stress: Not on file  Relationships  . Social Herbalist on phone: Not on file    Gets together: Not on file    Attends religious service: Not on file    Active member of club or organization: Not on file    Attends meetings of clubs or organizations: Not on file    Relationship status: Not on file  . Intimate partner violence    Fear of current or ex partner: Not on file    Emotionally abused: Not on file    Physically abused: Not on file    Forced sexual activity: Not on file  Other Topics Concern  . Not on file  Social History Narrative  . Not on file    FAMILY HISTORY:   Family Status  Relation Name Status  . Mother  Alive       breast cancer, essential tremor  . Father  Deceased       melanoma  . Brother  Alive       healthy  . Son  Alive       healthy     ROS:  Review of Systems  Constitutional: Negative.   HENT: Negative.   Eyes: Negative.   Respiratory: Negative.   Cardiovascular: Negative.   Gastrointestinal: Negative.   Skin: Negative.   Neurological: Positive for tremors.  Endo/Heme/Allergies: Bruises/bleeds easily (due to "old age").    PHYSICAL EXAMINATION:    VITALS:   Vitals:   01/05/19 1128  BP: 119/71  Pulse: 81  Resp: 16  Temp: 98.2 F (36.8 C)  SpO2: 97%  Weight: 146 lb 3.2 oz (66.3 kg)  Height: 5\' 6"  (1.676 m)    GEN:  The patient appears stated age and is in NAD. HEENT:  Normocephalic, atraumatic.  The mucous membranes are moist. The superficial temporal arteries are without ropiness or tenderness. CV:  RRR Lungs:  CTAB Neck/HEME:  There are no carotid bruits bilaterally.  Neurological examination:  Orientation: The patient is alert and oriented x3. Cranial nerves: There is good facial symmetry without facial hypomimia. The speech is fluent and clear. Soft palate rises symmetrically and there is no tongue deviation. Hearing is intact to conversational tone. Sensation: Sensation is intact to light touch throughout Motor: Strength is at least antigravity x4.  Movement examination: Tone: There is normal tone in the UE/LE Abnormal movements: mild head tremor.  There is mild postural tremor bilaterally. She has mild trouble with Archimedes spirals bilaterally. Coordination:  There is no decremation with RAM's. Gait and Station: The patient has no difficulty arising out of a deep-seated chair without the use of the hands. The patient's stride length is good.     ASSESSMENT/PLAN:  1.  ET  -on trokendi - 100 mg daily - controls ET fairly well and migraine  -primidone caused ? Bad dreams  2.  F/u 1 year  Cc:  Carol Ada, MD

## 2019-01-05 ENCOUNTER — Other Ambulatory Visit: Payer: Self-pay

## 2019-01-05 ENCOUNTER — Encounter: Payer: Self-pay | Admitting: Neurology

## 2019-01-05 ENCOUNTER — Ambulatory Visit (INDEPENDENT_AMBULATORY_CARE_PROVIDER_SITE_OTHER): Payer: Medicare Other | Admitting: Neurology

## 2019-01-05 VITALS — BP 119/71 | HR 81 | Temp 98.2°F | Resp 16 | Ht 66.0 in | Wt 146.2 lb

## 2019-01-05 DIAGNOSIS — G250 Essential tremor: Secondary | ICD-10-CM

## 2019-01-05 DIAGNOSIS — G43009 Migraine without aura, not intractable, without status migrainosus: Secondary | ICD-10-CM

## 2019-01-05 NOTE — Patient Instructions (Signed)
No changes from me.    The physicians and staff at [facility] are committed to providing excellent care. You may receive a survey requesting feedback about your experience at our office. We strive to receive "very good" responses to the survey questions. If you feel that your experience would prevent you from giving the office a "very good " response, please contact our office to try to remedy the situation. We may be reached at [phone]. Thank you for taking the time out of your busy day to complete the survey.

## 2019-02-17 ENCOUNTER — Ambulatory Visit
Admission: RE | Admit: 2019-02-17 | Discharge: 2019-02-17 | Disposition: A | Discharge status: Home / Self Care | Payer: Medicare Other | Source: Ambulatory Visit | Attending: Family Medicine | Admitting: Family Medicine

## 2019-02-17 ENCOUNTER — Other Ambulatory Visit: Payer: Self-pay | Admitting: Family Medicine

## 2019-02-17 DIAGNOSIS — S139XXA Sprain of joints and ligaments of unspecified parts of neck, initial encounter: Secondary | ICD-10-CM

## 2019-02-22 ENCOUNTER — Other Ambulatory Visit: Payer: Self-pay | Admitting: Neurology

## 2019-03-18 ENCOUNTER — Ambulatory Visit: Payer: Medicare Other | Attending: Internal Medicine

## 2019-03-18 DIAGNOSIS — Z23 Encounter for immunization: Secondary | ICD-10-CM | POA: Insufficient documentation

## 2019-03-18 NOTE — Progress Notes (Signed)
   Covid-19 Vaccination Clinic  Name:  Carol Levy    MRN: [id number] DOB: 1945/12/03  03/18/2019  [name] was observed post Covid-19 immunization for 15 minutes without incidence. She was provided with Vaccine Information Sheet and instruction to access the V-Safe system.   [name] was instructed to call 911 with any severe reactions post vaccine: Marland Kitchen Difficulty breathing  . Swelling of your face and throat  . A fast heartbeat  . A bad rash all over your body  . Dizziness and weakness    Immunizations Administered    Name Date Dose VIS Date Route   Pfizer COVID-19 Vaccine 03/18/2019 11:57 AM 0.3 mL 02/05/2019 Intramuscular   Manufacturer: [place]   Lot: [id number]   [place]: [id number]

## 2019-04-08 ENCOUNTER — Ambulatory Visit: Payer: Medicare Other | Attending: Internal Medicine

## 2019-04-08 DIAGNOSIS — Z23 Encounter for immunization: Secondary | ICD-10-CM | POA: Insufficient documentation

## 2019-04-08 NOTE — Progress Notes (Signed)
   Covid-19 Vaccination Clinic  Name:  Carol Levy    MRN: [id number] DOB: May 23, 1945  04/08/2019  Carol Levy was observed post Covid-19 immunization for 15 minutes without incidence. She was provided with Vaccine Information Sheet and instruction to access the V-Safe system.   Carol Levy was instructed to call 911 with any severe reactions post vaccine: Marland Kitchen Difficulty breathing  . Swelling of your face and throat  . A fast heartbeat  . A bad rash all over your body  . Dizziness and weakness    Immunizations Administered    Name Date Dose VIS Date Route   Pfizer COVID-19 Vaccine 04/08/2019 12:13 PM 0.3 mL 02/05/2019 Intramuscular   Manufacturer: [place]   Lot: [id number]   [place]: [id number]

## 2019-06-18 ENCOUNTER — Other Ambulatory Visit: Payer: Self-pay | Admitting: Neurology

## 2019-10-16 ENCOUNTER — Other Ambulatory Visit: Payer: Self-pay | Admitting: Neurology

## 2019-10-19 NOTE — Telephone Encounter (Signed)
Rx(s) sent to pharmacy electronically.  

## 2019-10-21 ENCOUNTER — Other Ambulatory Visit: Payer: Self-pay | Admitting: Family Medicine

## 2019-10-21 DIAGNOSIS — Z1231 Encounter for screening mammogram for malignant neoplasm of breast: Secondary | ICD-10-CM

## 2019-11-03 ENCOUNTER — Ambulatory Visit
Admission: RE | Admit: 2019-11-03 | Discharge: 2019-11-03 | Disposition: A | Discharge status: Home / Self Care | Payer: Medicare Other | Source: Ambulatory Visit | Attending: Family Medicine | Admitting: Family Medicine

## 2019-11-03 ENCOUNTER — Other Ambulatory Visit: Payer: Self-pay

## 2019-11-03 DIAGNOSIS — Z1231 Encounter for screening mammogram for malignant neoplasm of breast: Secondary | ICD-10-CM
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
## 2020-01-06 ENCOUNTER — Other Ambulatory Visit: Payer: Self-pay

## 2020-01-06 ENCOUNTER — Ambulatory Visit (INDEPENDENT_AMBULATORY_CARE_PROVIDER_SITE_OTHER): Payer: Medicare Other | Admitting: Neurology

## 2020-01-06 ENCOUNTER — Encounter: Payer: Self-pay | Admitting: Neurology

## 2020-01-06 VITALS — BP 130/75 | HR 76 | Ht 66.0 in | Wt 145.0 lb

## 2020-01-06 DIAGNOSIS — G250 Essential tremor: Secondary | ICD-10-CM | POA: Diagnosis not present

## 2020-01-06 MED ORDER — TROKENDI XR 100 MG PO CP24: 1.0000 | ORAL_CAPSULE | Freq: Every day | ORAL | 3 refills | Status: AC

## 2020-01-06 NOTE — Patient Instructions (Signed)
No changes in your medication
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
## 2020-04-05 ENCOUNTER — Other Ambulatory Visit: Payer: Self-pay | Admitting: Family Medicine

## 2020-04-05 DIAGNOSIS — E2839 Other primary ovarian failure: Secondary | ICD-10-CM

## 2020-04-05 DIAGNOSIS — Z1231 Encounter for screening mammogram for malignant neoplasm of breast: Secondary | ICD-10-CM

## 2020-04-06 ENCOUNTER — Other Ambulatory Visit: Payer: Self-pay | Admitting: Family Medicine

## 2020-04-06 DIAGNOSIS — Z9011 Acquired absence of right breast and nipple: Secondary | ICD-10-CM

## 2020-04-06 DIAGNOSIS — Z1231 Encounter for screening mammogram for malignant neoplasm of breast: Secondary | ICD-10-CM

## 2020-06-13 ENCOUNTER — Other Ambulatory Visit (HOSPITAL_BASED_OUTPATIENT_CLINIC_OR_DEPARTMENT_OTHER): Payer: Self-pay

## 2020-06-14 ENCOUNTER — Other Ambulatory Visit: Payer: Self-pay

## 2020-06-14 ENCOUNTER — Ambulatory Visit: Payer: Medicare Other | Attending: Internal Medicine

## 2020-06-14 DIAGNOSIS — Z23 Encounter for immunization: Secondary | ICD-10-CM

## 2020-06-14 NOTE — Progress Notes (Signed)
   Covid-19 Vaccination Clinic  Name:  Carol Levy    MRN: [id number] DOB: 04/15/1945  06/14/2020  Carol Levy was observed post Covid-19 immunization for 15 minutes without incident. She was provided with Vaccine Information Sheet and instruction to access the V-Safe system.   Carol Levy was instructed to call 911 with any severe reactions post vaccine: Marland Kitchen Difficulty breathing  . Swelling of face and throat  . A fast heartbeat  . A bad rash all over body  . Dizziness and weakness   Immunizations Administered    Name Date Dose VIS Date Route   PFIZER Comrnaty([place]) Covid-19 Vaccine 06/14/2020  3:34 PM 0.3 mL 02/03/2020 Intramuscular   Manufacturer: [place]   Lot: [id number]   [facility]: [phone]

## 2020-11-06 ENCOUNTER — Ambulatory Visit
Admission: RE | Admit: 2020-11-06 | Discharge: 2020-11-06 | Disposition: A | Discharge status: Home / Self Care | Payer: Medicare Other | Source: Ambulatory Visit | Attending: Family Medicine | Admitting: Family Medicine

## 2020-11-06 ENCOUNTER — Other Ambulatory Visit: Payer: Self-pay

## 2020-11-06 DIAGNOSIS — Z1231 Encounter for screening mammogram for malignant neoplasm of breast: Secondary | ICD-10-CM

## 2020-11-06 DIAGNOSIS — E2839 Other primary ovarian failure: Secondary | ICD-10-CM

## 2020-11-06 DIAGNOSIS — Z9011 Acquired absence of right breast and nipple: Secondary | ICD-10-CM

## 2021-05-14 ENCOUNTER — Other Ambulatory Visit: Payer: Self-pay | Admitting: Neurology

## 2021-05-18 ENCOUNTER — Other Ambulatory Visit: Payer: Self-pay | Admitting: Neurology

## 2021-06-05 ENCOUNTER — Telehealth: Payer: Self-pay | Admitting: Neurology

## 2021-06-05 NOTE — Telephone Encounter (Signed)
[facility] on [address]  ? Patient needs a refill on the trokendi ?Patient has a follow up appt schedule with Tat on 07-26-21 @ 2:30 ?

## 2021-06-05 NOTE — Telephone Encounter (Signed)
Called patient back to discuss notes from Dr. Carles Collet stating that the patient was to be followed by PCP. Patient has not been seen in our office since 01-06-20. I simply told patient what the notes said and that I would not be able to refill medication at this time until she was seen by Dr. Carles Collet .Pateitn upset with me and said she would find another doctor. I did tell patient if her symptoms were getting worse or if there was a reason she needed to see dr Tat then she should see her PCP as Dr. Carles Collet has told her at the last appointment she had in our office.  ? ? ?

## 2021-07-26 ENCOUNTER — Ambulatory Visit: Payer: Medicare Other | Admitting: Neurology

## 2021-08-16 ENCOUNTER — Encounter (HOSPITAL_BASED_OUTPATIENT_CLINIC_OR_DEPARTMENT_OTHER): Payer: Self-pay | Admitting: Obstetrics and Gynecology

## 2021-08-16 ENCOUNTER — Emergency Department (HOSPITAL_BASED_OUTPATIENT_CLINIC_OR_DEPARTMENT_OTHER): Payer: Medicare Other

## 2021-08-16 ENCOUNTER — Emergency Department (HOSPITAL_BASED_OUTPATIENT_CLINIC_OR_DEPARTMENT_OTHER)
Admission: EM | Admit: 2021-08-16 | Discharge: 2021-08-16 | Disposition: A | Discharge status: Home / Self Care | Payer: Medicare Other | Attending: Emergency Medicine | Admitting: Emergency Medicine

## 2021-08-16 ENCOUNTER — Other Ambulatory Visit: Payer: Self-pay

## 2021-08-16 DIAGNOSIS — I10 Essential (primary) hypertension: Secondary | ICD-10-CM | POA: Diagnosis not present

## 2021-08-16 DIAGNOSIS — S92355A Nondisplaced fracture of fifth metatarsal bone, left foot, initial encounter for closed fracture: Secondary | ICD-10-CM | POA: Diagnosis not present

## 2021-08-16 DIAGNOSIS — S93402A Sprain of unspecified ligament of left ankle, initial encounter: Secondary | ICD-10-CM | POA: Diagnosis not present

## 2021-08-16 DIAGNOSIS — W1839XA Other fall on same level, initial encounter: Secondary | ICD-10-CM | POA: Diagnosis not present

## 2021-08-16 DIAGNOSIS — Z853 Personal history of malignant neoplasm of breast: Secondary | ICD-10-CM | POA: Diagnosis not present

## 2021-08-16 DIAGNOSIS — Z79899 Other long term (current) drug therapy: Secondary | ICD-10-CM | POA: Diagnosis not present

## 2021-08-16 DIAGNOSIS — S99922A Unspecified injury of left foot, initial encounter: Secondary | ICD-10-CM | POA: Diagnosis present

## 2021-08-16 NOTE — Discharge Instructions (Addendum)
Recommend using the walker to help with ambulation, you may bear weight on your foot as tolerated.  Follow up in 1-2 weeks with orthopedics as above. Thank you, it was a pleasure caring for you today!  For your pain, you may take up to '1000mg'$  of acetaminophen (tylenol) 4 times daily for up to a week. This is the maximum dose of acetminophen (tylenol) you can take from all sources. Please check other over-the-counter medications and prescriptions to ensure you are not taking other medications that contain acetaminophen.  You may also take ibuprofen 400 mg 6 times a day OR '600mg'$  4 times a day alternating with or at the same time as tylenol and you may also use a lidocaine patch.

## 2021-08-16 NOTE — ED Triage Notes (Signed)
Patient reports to the ER for a fall that happened on Tuesday. Patient reports her left foot had fallen asleep and when she tried to walk her foot rolled and swelled and has not returned to normal. Patient reports she has difficulty walking on it.

## 2021-08-16 NOTE — ED Provider Notes (Signed)
[facility] EMERGENCY DEPT Provider Note   CSN: [id number] Arrival date & time: 08/16/21  6962     History  Chief Complaint  Patient presents with   Ankle Pain   Foot Pain    Carol Levy is a 76 y.o. female.  HPI     76 year old female with a history of hypertension, hyperlipidemia, breast cancer, essential tremor, who presents with concern for ankle and foot pain after rolling her ankle 2 days ago.  Reports she was sitting in a way that her left foot fell asleep then she tried to walk and her foot rolled. Felt a pop in her ankle and since then has had difficulty walking on it. Started using her husband's cane.   Denies other injuries, no head injury, headache, neck pain, back pain. Had numbness just in foot from sitting that resolved shortly after this incident/she began to walk.  Denies other numbness, weakness, difficulty talking or walking, visual changes or facial droop.  Not on anticoagulation.  Past Medical History:  Diagnosis Date   Anemia    hx of    Arthritis    Breast cancer (HCC)    L breast IDC in 2004, R breast IDC with DCIS in 2008   Essential tremor    Family history of adverse reaction to anesthesia    mother- mother had reaction to some anesthesia years ago per patient    History of chemotherapy    4 cycles TCH with Herceptin for one year in 2008   Hypercholesterolemia    Hypertension    S/P radiation therapy    12/01/02 to 01/19/03   Seasonal allergies      Home Medications Prior to Admission medications   Medication Sig Start Date End Date Taking? Authorizing Provider  atenolol (TENORMIN) 50 MG tablet Take 50 mg by mouth daily.    [provider]  atorvastatin (LIPITOR) 80 MG tablet Take 80 mg by mouth daily.    [provider]  cetirizine (ZYRTEC) 10 MG tablet Take 10 mg by mouth daily.    [provider]  ezetimibe (ZETIA) 10 MG tablet Take 10 mg by mouth daily.    [provider]   lisinopril (PRINIVIL,ZESTRIL) 20 MG tablet Take 20 mg by mouth daily.    [provider]  Topiramate ER (TROKENDI XR) 100 MG CP24 Take 1 capsule by mouth daily. 01/06/20   TatOctaviano Batty, DO      Allergies    Patient has no known allergies.    Review of Systems   Review of Systems  Physical Exam Updated Vital Signs BP 114/72   Pulse 70   Temp 98 F (36.7 C) (Oral)   Resp 18   Ht 5\' 6"  (1.676 m)   Wt 63.5 kg   SpO2 100%   BMI 22.60 kg/m  Physical Exam Vitals and nursing note reviewed.  Constitutional:      General: She is not in acute distress.    Appearance: Normal appearance. She is not ill-appearing, toxic-appearing or diaphoretic.  HENT:     Head: Normocephalic and atraumatic.  Eyes:     General: No visual field deficit.    Extraocular Movements: Extraocular movements intact.     Conjunctiva/sclera: Conjunctivae normal.     Pupils: Pupils are equal, round, and reactive to light.  Cardiovascular:     Rate and Rhythm: Normal rate and regular rhythm.     Pulses: Normal pulses.  Pulmonary:     Effort: Pulmonary effort  is normal. No respiratory distress.  Musculoskeletal:        General: Swelling and tenderness (5th metatarasal, lateral maleolus, ATFL) present. No deformity or signs of injury.     Cervical back: Normal range of motion. No rigidity.  Skin:    General: Skin is warm and dry.     Coloration: Skin is not jaundiced or pale.     Findings: Bruising present. No erythema or rash.  Neurological:     General: No focal deficit present.     Mental Status: She is alert and oriented to person, place, and time.     GCS: GCS eye subscore is 4. GCS verbal subscore is 5. GCS motor subscore is 6.     Cranial Nerves: No cranial nerve deficit, dysarthria or facial asymmetry.     Sensory: No sensory deficit.     Motor: No weakness or tremor.     Coordination: Coordination normal. Finger-Nose-Finger Test normal.     Gait: Gait normal.     ED Results /  Procedures / Treatments   Labs (all labs ordered are listed, but only abnormal results are displayed) Labs Reviewed - No data to display  EKG None  Radiology DG Foot Complete Left  Result Date: 08/16/2021 CLINICAL DATA:  Fall, foot injury, left ankle and foot pain/swelling EXAM: LEFT FOOT - COMPLETE 3+ VIEW; LEFT ANKLE COMPLETE - 3+ VIEW COMPARISON:  None Available. FINDINGS: Left foot: There is a nondisplaced base of fifth metatarsal fracture, likely in zone 1. 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 There is mild-to-moderate degenerative changes in the midfoot and of the interphalangeal joints. There is moderate calcaneocuboid degenerative change. Chronic bony remodeling of the fifth toe. Left ankle: Evidence of old medial malleolar injury. There is mild-to-moderate tibiotalar osteoarthritis. Plantar and dorsal calcaneal spurring. There is diffuse ankle and foot soft tissue swelling. IMPRESSION: Nondisplaced base of fifth metatarsal fracture. No evidence of acute ankle fracture. Diffuse ankle and foot soft tissue swelling. Electronically Signed   By: Caprice Renshaw M.D.   On: 08/16/2021 08:52   DG Ankle Complete Left  Result Date: 08/16/2021 CLINICAL DATA:  Fall, foot injury, left ankle and foot pain/swelling EXAM: LEFT FOOT - COMPLETE 3+ VIEW; LEFT ANKLE COMPLETE - 3+ VIEW COMPARISON:  None Available. FINDINGS: Left foot: There is a nondisplaced base of fifth metatarsal fracture, likely in zone 1. 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 There is mild-to-moderate degenerative changes in the midfoot and of the interphalangeal joints. There is moderate calcaneocuboid degenerative change. Chronic bony remodeling of the fifth toe. Left ankle: Evidence of old medial malleolar injury. There is mild-to-moderate tibiotalar osteoarthritis. Plantar and dorsal calcaneal spurring. There is diffuse ankle and foot soft tissue swelling. IMPRESSION: Nondisplaced base of fifth metatarsal fracture. No  evidence of acute ankle fracture. Diffuse ankle and foot soft tissue swelling. Electronically Signed   By: Caprice Renshaw M.D.   On: 08/16/2021 08:52    Procedures Procedures    Medications Ordered in ED Medications - No data to display  ED Course/ Medical Decision Making/ A&P                           Medical Decision Making Amount and/or Complexity of Data Reviewed Radiology: ordered.   76 year old female with a history of hypertension, hyperlipidemia, breast cancer, essential tremor, who presents with concern for ankle and foot pain after rolling her ankle 2 days ago.  Had temporary foot numbness related  to position, no other neurologic symptoms or signs to suggest TIA or CVA.   Denies other injuries or acute medical concerns.  Normal pulses in bilateral LE.  NV intact, closed fracture.  Pain and swelling to LLE, XR shows nondisplaced 5th metatarsal fracture in zone one.  Placed in CAM walker, recommend WBAT, walker to help with ambulation and outpatient follow up with Orthopedics. Patient discharged in stable condition with understanding of reasons to return.         Final Clinical Impression(s) / ED Diagnoses Final diagnoses:  Closed nondisplaced fracture of fifth metatarsal bone of left foot, initial encounter  Sprain of left ankle, unspecified ligament, initial encounter    Rx / DC Orders ED Discharge Orders     None         Alvira Monday, MD 08/16/21 2124

## 2021-10-23 NOTE — Progress Notes (Unsigned)
Assessment/Plan:    1.  Essential Tremor  -continue trokendi '100mg'$  daily, which helps tremor and HA prophylaxis  2. Since doing well, f/u prn.  One year supply of medication given to patient.  After that, pt to f/u with PCP.  Subjective:   Carol Levy was seen today in follow up for essential tremor.  My previous records were reviewed prior to todays visit.  Last visit was close to 2 years ago.  At that point in time, patient was doing well and was just following up for refills.  I told her that she could just follow-up with her primary care physician.  She called around the April timeframe looking for refills, but we had not seen her in about a year and a half and told her that we had released her to her primary care physician, but if she wanted to be seen here she would need to follow-up.  Patient was very upset, but ultimately follows up today.  ***ER records from June indicate patient did have a nondisplaced fifth metatarsal fracture.  The patient reports that her foot had fallen asleep when she stood up to walk and her foot rolled and she felt a pop.  She was placed in a CAM boot from the emergency room and followed up with orthopedics.  Current prescribed movement disorder medications:  Trokendi, '100mg'$  daily  PREVIOUS MEDICATIONS: primidone (dreams)  ALLERGIES:  No Known Allergies  CURRENT MEDICATIONS:  Outpatient Encounter Medications as of 10/25/2021  Medication Sig   atenolol (TENORMIN) 50 MG tablet Take 50 mg by mouth daily.   atorvastatin (LIPITOR) 80 MG tablet Take 80 mg by mouth daily.   cetirizine (ZYRTEC) 10 MG tablet Take 10 mg by mouth daily.   ezetimibe (ZETIA) 10 MG tablet Take 10 mg by mouth daily.   lisinopril (PRINIVIL,ZESTRIL) 20 MG tablet Take 20 mg by mouth daily.   Topiramate ER (TROKENDI XR) 100 MG CP24 Take 1 capsule by mouth daily.   No facility-administered encounter medications on file as of 10/25/2021.     Objective:    PHYSICAL  EXAMINATION:    VITALS:   There were no vitals filed for this visit.   GEN:  The patient appears stated age and is in NAD. HEENT:  Normocephalic, atraumatic.  The mucous membranes are moist. The superficial temporal arteries are without ropiness or tenderness. CV:  RRR Lungs:  CTAB Neck/HEME:  There are no carotid bruits bilaterally.  Neurological examination:  Orientation: The patient is alert and oriented x3. Cranial nerves: There is good facial symmetry. The speech is fluent and clear. Soft palate rises symmetrically and there is no tongue deviation. Hearing is intact to conversational tone. Sensation: Sensation is intact to light touch throughout Motor: Strength is at least antigravity x4.  Movement examination: Tone: There is normal tone in the UE/LE Abnormal movements: there is postural tremor, R>L.  Mild trouble with archimedes spirals bilaterally Coordination:  There is no decremation with RAM's, with any form of RAMS, including alternating supination and pronation of the forearm, hand opening and closing, finger taps, heel taps and toe taps. Gait and Station: The patient ambulates well in the hall I have reviewed and interpreted the following labs independently   Chemistry      Component Value Date/Time   NA 137 01/22/2017 0558   K 3.7 01/22/2017 0558   CL 109 01/22/2017 0558   CO2 23 01/22/2017 0558   BUN 20 01/22/2017 0558   CREATININE 0.87 01/22/2017  0558      Component Value Date/Time   CALCIUM 9.1 01/22/2017 0558   ALKPHOS 68 01/14/2017 1533   AST 32 01/14/2017 1533   ALT 31 01/14/2017 1533   BILITOT 0.8 01/14/2017 1533      Lab Results  Component Value Date   WBC 7.3 01/22/2017   HGB 9.9 (L) 01/22/2017   HCT 29.8 (L) 01/22/2017   MCV 88.7 01/22/2017   PLT 130 (L) 01/22/2017   No results found for: "TSH"   Chemistry      Component Value Date/Time   NA 137 01/22/2017 0558   K 3.7 01/22/2017 0558   CL 109 01/22/2017 0558   CO2 23 01/22/2017 0558    BUN 20 01/22/2017 0558   CREATININE 0.87 01/22/2017 0558      Component Value Date/Time   CALCIUM 9.1 01/22/2017 0558   ALKPHOS 68 01/14/2017 1533   AST 32 01/14/2017 1533   ALT 31 01/14/2017 1533   BILITOT 0.8 01/14/2017 1533         Total time spent on today's visit was *** minutes, including both face-to-face time and nonface-to-face time.  Time included that spent on review of records (prior notes available to me/labs/imaging if pertinent), discussing treatment and goals, answering patient's questions and coordinating care.  Cc:  Carol Ada, MD

## 2021-10-25 ENCOUNTER — Ambulatory Visit (INDEPENDENT_AMBULATORY_CARE_PROVIDER_SITE_OTHER): Payer: Medicare Other | Admitting: Neurology

## 2021-10-25 ENCOUNTER — Other Ambulatory Visit (HOSPITAL_COMMUNITY): Payer: Self-pay

## 2021-10-25 ENCOUNTER — Encounter: Payer: Self-pay | Admitting: Neurology

## 2021-10-25 ENCOUNTER — Telehealth (HOSPITAL_COMMUNITY): Payer: Self-pay | Admitting: Pharmacy Technician

## 2021-10-25 ENCOUNTER — Telehealth: Payer: Self-pay

## 2021-10-25 VITALS — BP 110/60 | HR 68 | Ht 66.0 in | Wt 138.8 lb

## 2021-10-25 DIAGNOSIS — G243 Spasmodic torticollis: Secondary | ICD-10-CM | POA: Diagnosis not present

## 2021-10-25 DIAGNOSIS — G250 Essential tremor: Secondary | ICD-10-CM

## 2021-10-25 NOTE — Telephone Encounter (Signed)
New start up for patietn Xeomin 200 units for cervical dystonia has been sent to the PA team for approval

## 2021-10-25 NOTE — Telephone Encounter (Signed)
Patient Advocate Encounter  Received notification that the request for prior authorization for Xeomin 200UNIT solution  has been denied due to must a trial and failure or intolerance to Dysport (abobotulinumtoxinA).Lyndel Safe, [place] Patient Advocate Specialist [place] Patient Advocate Team Direct Number: [phone]  Fax: [phone]

## 2021-10-25 NOTE — Telephone Encounter (Signed)
Could you try to run it for Botox 200 units and see if we get the same result?

## 2021-10-25 NOTE — Telephone Encounter (Signed)
Patient Advocate Encounter   Received notification that prior authorization for Xeomin 200UNIT solution is required.   PA submitted on 10/25/2021 Key [id number] Status is pending       Lyndel Safe, [place] Patient Advocate Specialist [place] Patient Advocate Team Direct Number: [phone]  Fax: [phone]

## 2021-10-26 ENCOUNTER — Other Ambulatory Visit: Payer: Self-pay

## 2021-10-26 ENCOUNTER — Other Ambulatory Visit (HOSPITAL_COMMUNITY): Payer: Self-pay

## 2021-10-26 MED ORDER — BOTOX 100 UNITS IJ SOLR
INTRAMUSCULAR | 1 refills | Status: DC
  Filled 2021-10-26: qty 2, fill #0
  Filled 2021-11-21: qty 2, 90d supply, fill #0

## 2021-10-26 NOTE — Telephone Encounter (Signed)
Patient Advocate Encounter  Prior Authorization for Botox 200UNIT solution  has been approved.    PA# [id number] Effective dates: 10/26/2021 through 01/25/2022  Can be filled at [place], [place] Patient [place] Patient Advocate Team Direct Number: [phone]  Fax: [phone]

## 2021-10-26 NOTE — Telephone Encounter (Signed)
Patient Advocate Encounter   Received notification that prior authorization for Botox 200UNIT solution is required.   PA submitted on 10/26/2021 Key [id number] Status is pending       Lyndel Safe, [place] Patient Advocate Specialist [place] Patient Advocate Team Direct Number: [phone]  Fax: [phone]

## 2021-10-26 NOTE — Telephone Encounter (Signed)
Submitted benefits to [employer]- [id number]

## 2021-10-30 ENCOUNTER — Telehealth: Payer: Self-pay | Admitting: Neurology

## 2021-10-30 ENCOUNTER — Other Ambulatory Visit (HOSPITAL_COMMUNITY): Payer: Self-pay

## 2021-10-30 NOTE — Telephone Encounter (Signed)
I got her schedule for Botox and she is going to get it shipped to [place]

## 2021-10-30 NOTE — Telephone Encounter (Signed)
The following message was left with [employer] on 10/30/21 at 1:03 PM.   Caller states she is a pt of Dr. Doristine Devoid. She states there was a medication ordered that she has to administer. The pharmacy told her the Rx is ready and it can be sent to the office.

## 2021-11-19 ENCOUNTER — Other Ambulatory Visit (HOSPITAL_COMMUNITY): Payer: Self-pay

## 2021-11-20 ENCOUNTER — Other Ambulatory Visit (HOSPITAL_COMMUNITY): Payer: Self-pay

## 2021-11-21 ENCOUNTER — Other Ambulatory Visit (HOSPITAL_COMMUNITY): Payer: Self-pay

## 2021-11-27 ENCOUNTER — Other Ambulatory Visit (HOSPITAL_COMMUNITY): Payer: Self-pay

## 2021-11-28 ENCOUNTER — Other Ambulatory Visit (HOSPITAL_COMMUNITY): Payer: Self-pay

## 2021-11-30 ENCOUNTER — Ambulatory Visit (INDEPENDENT_AMBULATORY_CARE_PROVIDER_SITE_OTHER): Payer: Medicare Other | Admitting: Neurology

## 2021-11-30 DIAGNOSIS — G243 Spasmodic torticollis: Secondary | ICD-10-CM

## 2021-11-30 MED ORDER — ONABOTULINUMTOXINA 100 UNITS IJ SOLR
200.0000 [IU] | Freq: Once | INTRAMUSCULAR | Status: AC
  Administered 2021-11-30: 180 [IU] via INTRAMUSCULAR

## 2021-11-30 NOTE — Procedures (Signed)
[facility]   Procedure Note Botox  Attending: Dr. Wells Guiles Jann Ra  Preoperative Diagnosis(es): Cervical Dystonia  Result History  N/a  Consent obtained from: Family Benefits discussed included, but were not limited to decreased muscle tightness, increased joint range of motion, and decreased pain.  Risk discussed included, but were not limited pain and discomfort, bleeding, bruising, excessive weakness, venous thrombosis, muscle atrophy and dysphagia.  A copy of the patient medication guide was given to the patient which explains the blackbox warning.  Patients identity and treatment sites confirmed Yes.  .  Details of Procedure: Skin was cleaned with alcohol.  A 30 gauge, 58m  needle was introduced to the target muscle, except for posterior splenius where 27 gauge, 1.5 inch needle used.   Prior to injection, the needle plunger was aspirated to make sure the needle was not within a blood vessel.  There was no blood retrieved on aspiration.    Following is a summary of the muscles injected  And the amount of Botulinum toxin used:   Dilution 0.9% preservative free saline mixed with 100 u Botox type A to make 10 U per 0.1cc  Injections  Location Left  Right Units Number of sites        Sternocleidomastoid 30 60 90 2  Splenius Capitus, posterior approach 70  70 1  Splenius Capitus, lateral approach '20  20 1  '$ Levator Scapulae      Trapezius            TOTAL UNITS:   180    Agent: Botulinum Type A ( Onobotulinum Toxin type A ).  2 vials of Botox were used, each containing 100 units and freshly diluted with 1 mL of sterile, non-preserved saline   Total injected (Units):  180  Total wasted (Units): 20   Pt tolerated procedure well without complications.   Reinjection is anticipated in 3 months.

## 2021-12-11 ENCOUNTER — Other Ambulatory Visit: Payer: Self-pay | Admitting: Family Medicine

## 2021-12-11 DIAGNOSIS — Z1231 Encounter for screening mammogram for malignant neoplasm of breast: Secondary | ICD-10-CM

## 2021-12-28 NOTE — Progress Notes (Unsigned)
    Assessment/Plan:    1.  Essential Tremor  -continue trokendi '100mg'$  daily, which helps tremor and HA prophylaxis (now on generic brand)  2. Possible Cervical Dystonia  -Had first injections November 30, 2021.  Looks a bit underdosed.  Will use closer to 250 U next visit.   Subjective:   Carol Levy was seen today in follow up for Botox follow-up for possible dystonia.  She had her first Botox injections on October 6 and reports that it is generally doing better than it was.  She has no SE.    Current prescribed movement disorder medications:  Trokendi, '100mg'$  daily  PREVIOUS MEDICATIONS: primidone (dreams)  ALLERGIES:  No Known Allergies  CURRENT MEDICATIONS:  Outpatient Encounter Medications as of 12/31/2021  Medication Sig   atenolol (TENORMIN) 50 MG tablet Take 50 mg by mouth daily.   atorvastatin (LIPITOR) 80 MG tablet Take 80 mg by mouth daily.   botulinum toxin Type A (BOTOX) 100 units SOLR injection Inject 200 units into the head and neck every 90 days at the doctors office per the MD   cetirizine (ZYRTEC) 10 MG tablet Take 10 mg by mouth daily.   ezetimibe (ZETIA) 10 MG tablet Take 10 mg by mouth daily.   lisinopril (PRINIVIL,ZESTRIL) 20 MG tablet Take 20 mg by mouth daily.   Topiramate ER (TROKENDI XR) 100 MG CP24 Take 1 capsule by mouth daily.   No facility-administered encounter medications on file as of 12/31/2021.     Objective:    PHYSICAL EXAMINATION:    VITALS:   Vitals:   12/31/21 1353  BP: 126/72  Pulse: 68  SpO2: 98%  Weight: 142 lb 12.8 oz (64.8 kg)  Height: '5\' 6"'$  (1.676 m)      GEN:  The patient appears stated age and is in NAD. HEENT:  Normocephalic, atraumatic.  The mucous membranes are moist. The superficial temporal arteries are without ropiness or tenderness. Neck/HEME:  There are no carotid bruits bilaterally.  Head flexed some.  Head tremor in "no" direction, very mild.       Chemistry      Component Value Date/Time   NA  137 01/22/2017 0558   K 3.7 01/22/2017 0558   CL 109 01/22/2017 0558   CO2 23 01/22/2017 0558   BUN 20 01/22/2017 0558   CREATININE 0.87 01/22/2017 0558      Component Value Date/Time   CALCIUM 9.1 01/22/2017 0558   ALKPHOS 68 01/14/2017 1533   AST 32 01/14/2017 1533   ALT 31 01/14/2017 1533   BILITOT 0.8 01/14/2017 1533      Lab Results  Component Value Date   WBC 7.3 01/22/2017   HGB 9.9 (L) 01/22/2017   HCT 29.8 (L) 01/22/2017   MCV 88.7 01/22/2017   PLT 130 (L) 01/22/2017   No results found for: "TSH"   Chemistry      Component Value Date/Time   NA 137 01/22/2017 0558   K 3.7 01/22/2017 0558   CL 109 01/22/2017 0558   CO2 23 01/22/2017 0558   BUN 20 01/22/2017 0558   CREATININE 0.87 01/22/2017 0558      Component Value Date/Time   CALCIUM 9.1 01/22/2017 0558   ALKPHOS 68 01/14/2017 1533   AST 32 01/14/2017 1533   ALT 31 01/14/2017 1533   BILITOT 0.8 01/14/2017 1533          Cc:  Carol Ada, MD

## 2021-12-31 ENCOUNTER — Other Ambulatory Visit: Payer: Self-pay

## 2021-12-31 ENCOUNTER — Ambulatory Visit (INDEPENDENT_AMBULATORY_CARE_PROVIDER_SITE_OTHER): Payer: Medicare Other | Admitting: Neurology

## 2021-12-31 ENCOUNTER — Encounter: Payer: Self-pay | Admitting: Neurology

## 2021-12-31 VITALS — BP 126/72 | HR 68 | Ht 66.0 in | Wt 142.8 lb

## 2021-12-31 DIAGNOSIS — G243 Spasmodic torticollis: Secondary | ICD-10-CM | POA: Diagnosis not present

## 2021-12-31 DIAGNOSIS — G250 Essential tremor: Secondary | ICD-10-CM | POA: Diagnosis not present

## 2021-12-31 MED ORDER — BOTOX 100 UNITS IJ SOLR: INTRAMUSCULAR | 1 refills | Status: DC

## 2022-01-25 ENCOUNTER — Ambulatory Visit
Admission: RE | Admit: 2022-01-25 | Discharge: 2022-01-25 | Disposition: A | Discharge status: Home / Self Care | Payer: Medicare Other | Source: Ambulatory Visit | Attending: Family Medicine | Admitting: Family Medicine

## 2022-01-25 DIAGNOSIS — Z1231 Encounter for screening mammogram for malignant neoplasm of breast: Secondary | ICD-10-CM

## 2022-02-06 ENCOUNTER — Telehealth: Payer: Self-pay

## 2022-02-06 NOTE — Telephone Encounter (Signed)
Pharmacy Patient Advocate Encounter   Received notification that prior authorization for Botox 200UNIT solution is required/requested.   PA submitted on 02/06/2022  via CoverMyMeds Key [id number] Status is pending

## 2022-02-06 NOTE — Telephone Encounter (Signed)
[employer]-Benefit Verification [id number] Submitted!

## 2022-02-12 ENCOUNTER — Other Ambulatory Visit: Payer: Self-pay

## 2022-02-12 ENCOUNTER — Other Ambulatory Visit (HOSPITAL_COMMUNITY): Payer: Self-pay

## 2022-02-12 MED ORDER — BOTOX 100 UNITS IJ SOLR
INTRAMUSCULAR | 1 refills | Status: DC
  Filled 2022-02-12: qty 3, fill #0
  Filled 2022-02-14: qty 3, 30d supply, fill #0
  Filled 2022-02-28 (×2): qty 3, 90d supply, fill #0
  Filled 2022-05-23: qty 3, 90d supply, fill #1

## 2022-02-14 ENCOUNTER — Other Ambulatory Visit: Payer: Self-pay

## 2022-02-19 ENCOUNTER — Telehealth: Payer: Self-pay | Admitting: Neurology

## 2022-02-19 NOTE — Telephone Encounter (Signed)
Pharmacy Patient Advocate Encounter  Received notification from [employer] that the request for prior authorization for Botox 200UNIT solution has been denied due to BOTOX INJ 200UNIT is denied for not meeting the following prior authorization requirement(s). Authorization for continuation of therapy requires the following:  (1) At least three months have or will have elapsed since the last treatment with Botox.Marland Kitchen

## 2022-02-19 NOTE — Telephone Encounter (Signed)
Patients last appointment was on 11-30-21 Patient is scheduled for 03-01-2022 and Botox is scheduled every 90 days . I have not heard this before. Anyway you can help me to appeal this I can do whatever is needed. This patient is also using 300 units of Botox at this time

## 2022-02-19 NOTE — Telephone Encounter (Signed)
[employer] called in stating the prior auth for Botox was denied. An appeal with have to be done.

## 2022-02-21 NOTE — Telephone Encounter (Signed)
I got her sch

## 2022-02-21 NOTE — Telephone Encounter (Signed)
I will not be able to run the PA until 03/03/2022.

## 2022-02-22 ENCOUNTER — Other Ambulatory Visit (HOSPITAL_COMMUNITY): Payer: Self-pay

## 2022-02-26 ENCOUNTER — Other Ambulatory Visit (HOSPITAL_COMMUNITY): Payer: Self-pay

## 2022-02-26 ENCOUNTER — Other Ambulatory Visit: Payer: Self-pay

## 2022-02-26 NOTE — Telephone Encounter (Signed)
Called patient back and let her know PA team told me we had to wait until Jan 7th to rerun the PA

## 2022-02-26 NOTE — Telephone Encounter (Signed)
Pt called stating her insurance still not covering her Rx for Botox. Pt requests call back.

## 2022-02-27 ENCOUNTER — Other Ambulatory Visit (HOSPITAL_COMMUNITY): Payer: Self-pay

## 2022-02-28 ENCOUNTER — Other Ambulatory Visit (HOSPITAL_COMMUNITY): Payer: Self-pay

## 2022-02-28 NOTE — Telephone Encounter (Signed)
Patient Advocate Encounter  Prior Authorization for BOTOX 300 has been approved.    PA# [id number] Effective dates: 1.4.24 through 4.4.24

## 2022-03-01 ENCOUNTER — Ambulatory Visit: Payer: Medicare Other | Admitting: Neurology

## 2022-03-08 ENCOUNTER — Other Ambulatory Visit (HOSPITAL_COMMUNITY): Payer: Self-pay

## 2022-03-08 ENCOUNTER — Ambulatory Visit (INDEPENDENT_AMBULATORY_CARE_PROVIDER_SITE_OTHER): Payer: Medicare Other | Admitting: Neurology

## 2022-03-08 DIAGNOSIS — G243 Spasmodic torticollis: Secondary | ICD-10-CM | POA: Diagnosis not present

## 2022-03-08 MED ORDER — ONABOTULINUMTOXINA 100 UNITS IJ SOLR
300.0000 [IU] | Freq: Once | INTRAMUSCULAR | Status: AC
  Administered 2022-03-08: 280 [IU] via INTRAMUSCULAR

## 2022-03-08 NOTE — Procedures (Signed)
[facility]   Procedure Note Botox  Attending: Dr. Wells Guiles Lamesha Tibbits  Preoperative Diagnosis(es): Cervical Dystonia  Result History  Helped but not enough  Consent obtained from: Family Benefits discussed included, but were not limited to decreased muscle tightness, increased joint range of motion, and decreased pain.  Risk discussed included, but were not limited pain and discomfort, bleeding, bruising, excessive weakness, venous thrombosis, muscle atrophy and dysphagia.  A copy of the patient medication guide was given to the patient which explains the blackbox warning.  Patients identity and treatment sites confirmed Yes.  .  Details of Procedure: Skin was cleaned with alcohol.  A 30 gauge, 103m  needle was introduced to the target muscle, except for posterior splenius where 27 gauge, 1.5 inch needle used.   Prior to injection, the needle plunger was aspirated to make sure the needle was not within a blood vessel.  There was no blood retrieved on aspiration.    Following is a summary of the muscles injected  And the amount of Botulinum toxin used:   Dilution 0.9% preservative free saline mixed with 100 u Botox type A to make 10 U per 0.1cc  Injections  Location Left  Right Units Number of sites        Sternocleidomastoid 30 70 100 2  Splenius Capitus, posterior approach 100  100 1  Splenius Capitus, lateral approach '30  30 1  '$ Levator Scapulae      Trapezius            TOTAL UNITS:   230    Agent: Botulinum Type A ( Onobotulinum Toxin type A ). 3 vials of Botox were used, each containing 100 units and freshly diluted with 1 mL of sterile, non-preserved saline   Total injected (Units): 230  Total wasted (Units): 70   Pt tolerated procedure well without complications.   Reinjection is anticipated in 3 months.

## 2022-03-29 ENCOUNTER — Ambulatory Visit: Payer: Medicare Other | Admitting: Neurology

## 2022-05-14 ENCOUNTER — Other Ambulatory Visit (HOSPITAL_COMMUNITY): Payer: Self-pay

## 2022-05-17 ENCOUNTER — Other Ambulatory Visit (HOSPITAL_COMMUNITY): Payer: Self-pay

## 2022-05-22 ENCOUNTER — Other Ambulatory Visit: Payer: Self-pay

## 2022-05-23 ENCOUNTER — Other Ambulatory Visit (HOSPITAL_COMMUNITY): Payer: Self-pay

## 2022-05-23 ENCOUNTER — Telehealth: Payer: Self-pay | Admitting: Pharmacy Technician

## 2022-05-23 NOTE — Telephone Encounter (Signed)
Submitted a Prior Authorization request to [facility] for  Botox  via CoverMyMeds. Will update once we receive a response.  (KeyStandley Brooking) [id number]

## 2022-05-27 NOTE — Telephone Encounter (Signed)
Faxed additional to [employer] documenting patient being injected every 3 months.

## 2022-05-29 ENCOUNTER — Other Ambulatory Visit: Payer: Self-pay

## 2022-05-29 ENCOUNTER — Other Ambulatory Visit (HOSPITAL_COMMUNITY): Payer: Self-pay

## 2022-05-29 DIAGNOSIS — G243 Spasmodic torticollis: Secondary | ICD-10-CM

## 2022-05-29 MED ORDER — BOTOX 100 UNITS IJ SOLR
INTRAMUSCULAR | 1 refills | Status: DC
Start: 2022-05-29 — End: 2022-11-20
  Filled 2022-05-29: qty 3, 90d supply, fill #0
  Filled 2022-08-28: qty 3, 90d supply, fill #1

## 2022-05-29 MED ORDER — BOTOX 100 UNITS IJ SOLR
INTRAMUSCULAR | 1 refills | Status: DC
Start: 2022-05-29 — End: 2022-05-29

## 2022-05-29 MED ORDER — BOTOX 100 UNITS IJ SOLR
INTRAMUSCULAR | 1 refills | Status: DC
  Filled 2022-05-29: qty 3, fill #0

## 2022-05-29 NOTE — Telephone Encounter (Signed)
[employer] called and PA has been approved March 28-August 28 2022 Approval # [id number]

## 2022-05-31 ENCOUNTER — Other Ambulatory Visit (HOSPITAL_COMMUNITY): Payer: Self-pay

## 2022-06-03 ENCOUNTER — Other Ambulatory Visit (HOSPITAL_COMMUNITY): Payer: Self-pay

## 2022-06-07 ENCOUNTER — Ambulatory Visit (INDEPENDENT_AMBULATORY_CARE_PROVIDER_SITE_OTHER): Payer: Medicare Other | Admitting: Neurology

## 2022-06-07 DIAGNOSIS — G243 Spasmodic torticollis: Secondary | ICD-10-CM

## 2022-06-07 MED ORDER — ONABOTULINUMTOXINA 100 UNITS IJ SOLR
300.0000 [IU] | Freq: Once | INTRAMUSCULAR | Status: AC
  Administered 2022-06-07: 230 [IU] via INTRAMUSCULAR

## 2022-06-07 NOTE — Procedures (Signed)
Botulinum Clinic   Procedure Note Botox  Attending: Dr. Lurena Joiner Steward Sames  Preoperative Diagnosis(es): Cervical Dystonia  Result History  Helped - wore off a bit early  Consent obtained from: Family Benefits discussed included, but were not limited to decreased muscle tightness, increased joint range of motion, and decreased pain.  Risk discussed included, but were not limited pain and discomfort, bleeding, bruising, excessive weakness, venous thrombosis, muscle atrophy and dysphagia.  A copy of the patient medication guide was given to the patient which explains the blackbox warning.  Patients identity and treatment sites confirmed Yes.  .  Details of Procedure: Skin was cleaned with alcohol.  A 30 gauge, 61mm  needle was introduced to the target muscle, except for posterior splenius where 27 gauge, 1.5 inch needle used.   Prior to injection, the needle plunger was aspirated to make sure the needle was not within a blood vessel.  There was no blood retrieved on aspiration.    Following is a summary of the muscles injected  And the amount of Botulinum toxin used:   Dilution 0.9% preservative free saline mixed with 100 u Botox type A to make 10 U per 0.1cc  Injections  Location Left  Right Units Number of sites        Sternocleidomastoid 30 70 100 2  Splenius Capitus, posterior approach 100  100 1  Splenius Capitus, lateral approach 30  30 1   Levator Scapulae      Trapezius            TOTAL UNITS:   230    Agent: Botulinum Type A ( Onobotulinum Toxin type A ). 3 vials of Botox were used, each containing 100 units and freshly diluted with 1 mL of sterile, non-preserved saline   Total injected (Units): 230  Total wasted (Units): 70   Pt tolerated procedure well without complications.   Reinjection is anticipated in 3 months.

## 2022-08-20 ENCOUNTER — Other Ambulatory Visit (HOSPITAL_COMMUNITY): Payer: Self-pay

## 2022-08-23 ENCOUNTER — Other Ambulatory Visit (HOSPITAL_COMMUNITY): Payer: Self-pay

## 2022-08-26 ENCOUNTER — Other Ambulatory Visit (HOSPITAL_COMMUNITY): Payer: Self-pay

## 2022-08-28 ENCOUNTER — Other Ambulatory Visit (HOSPITAL_COMMUNITY): Payer: Self-pay

## 2022-08-30 ENCOUNTER — Other Ambulatory Visit (HOSPITAL_COMMUNITY): Payer: Self-pay

## 2022-09-03 ENCOUNTER — Telehealth: Payer: Self-pay | Admitting: Pharmacy Technician

## 2022-09-03 ENCOUNTER — Other Ambulatory Visit (HOSPITAL_COMMUNITY): Payer: Self-pay

## 2022-09-03 NOTE — Telephone Encounter (Signed)
Patient Advocate Encounter  Prior Authorization for BOTOX has been approved with [employer].    PA# [id number] Effective dates: 7.9.24 through 10.9.24  Per [facility] test claim, copay for 90 days supply is $150   Received notification from [employer] that prior authorization for BOTOX 100U is required.   PA submitted on 7.9.24 [facility]  Status is pending

## 2022-09-05 ENCOUNTER — Other Ambulatory Visit (HOSPITAL_COMMUNITY): Payer: Self-pay

## 2022-09-06 ENCOUNTER — Ambulatory Visit: Payer: Medicare Other | Admitting: Neurology

## 2022-09-06 ENCOUNTER — Encounter: Payer: Self-pay | Admitting: Neurology

## 2022-09-06 DIAGNOSIS — Z029 Encounter for administrative examinations, unspecified: Secondary | ICD-10-CM

## 2022-09-15 ENCOUNTER — Other Ambulatory Visit (HOSPITAL_COMMUNITY): Payer: Self-pay

## 2022-09-20 ENCOUNTER — Ambulatory Visit (INDEPENDENT_AMBULATORY_CARE_PROVIDER_SITE_OTHER): Payer: Medicare Other | Admitting: Neurology

## 2022-09-20 DIAGNOSIS — G243 Spasmodic torticollis: Secondary | ICD-10-CM

## 2022-09-20 MED ORDER — ONABOTULINUMTOXINA 100 UNITS IJ SOLR
300.0000 [IU] | Freq: Once | INTRAMUSCULAR | Status: AC
  Administered 2022-09-20: 230 [IU] via INTRAMUSCULAR

## 2022-09-20 NOTE — Procedures (Signed)
Botulinum Clinic   Procedure Note Botox  Attending: Dr. Lurena Joiner Yamileth Hayse  Preoperative Diagnosis(es): Cervical Dystonia  Result History  Doing well  Consent obtained from: Family Benefits discussed included, but were not limited to decreased muscle tightness, increased joint range of motion, and decreased pain.  Risk discussed included, but were not limited pain and discomfort, bleeding, bruising, excessive weakness, venous thrombosis, muscle atrophy and dysphagia.  A copy of the patient medication guide was given to the patient which explains the blackbox warning.  Patients identity and treatment sites confirmed Yes.  .  Details of Procedure: Skin was cleaned with alcohol.  A 30 gauge, 25mm  needle was introduced to the target muscle, except for posterior splenius where 27 gauge, 1.5 inch needle used.   Prior to injection, the needle plunger was aspirated to make sure the needle was not within a blood vessel.  There was no blood retrieved on aspiration.    Following is a summary of the muscles injected  And the amount of Botulinum toxin used:   Dilution 0.9% preservative free saline mixed with 100 u Botox type A to make 10 U per 0.1cc  Injections  Location Left  Right Units Number of sites        Sternocleidomastoid 30 70 100 2  Splenius Capitus, posterior approach 100  100 1  Splenius Capitus, lateral approach 30  30 1   Levator Scapulae      Trapezius            TOTAL UNITS:   230    Agent: Botulinum Type A ( Onobotulinum Toxin type A ). 3 vials of Botox were used, each containing 100 units and freshly diluted with 1 mL of sterile, non-preserved saline   Total injected (Units): 230  Total wasted (Units): 70   Pt tolerated procedure well without complications.   Reinjection is anticipated in 3 months.

## 2022-11-20 ENCOUNTER — Other Ambulatory Visit: Payer: Self-pay

## 2022-11-20 ENCOUNTER — Other Ambulatory Visit: Payer: Self-pay | Admitting: Neurology

## 2022-11-20 DIAGNOSIS — G243 Spasmodic torticollis: Secondary | ICD-10-CM

## 2022-11-20 MED ORDER — BOTOX 100 UNITS IJ SOLR
INTRAMUSCULAR | 1 refills | Status: DC
  Filled 2022-11-20: qty 3, 90d supply, fill #0
  Filled 2023-03-26: qty 3, 90d supply, fill #1

## 2022-11-20 NOTE — Progress Notes (Signed)
Specialty Pharmacy Refill Coordination Note  Carol Levy is a 77 y.o. female contacted today regarding refills of specialty medication(s) Onabotulinumtoxina .  Patient requested Courier to Provider Office  on 12/16/22  to verified address [facility] [address], [place], [address]   Medication will be filled on 12/13/2022.

## 2022-11-21 ENCOUNTER — Other Ambulatory Visit: Payer: Self-pay

## 2022-12-13 ENCOUNTER — Ambulatory Visit: Payer: Medicare Other | Admitting: Neurology

## 2022-12-13 ENCOUNTER — Telehealth: Payer: Self-pay | Admitting: Pharmacy Technician

## 2022-12-13 ENCOUNTER — Other Ambulatory Visit (HOSPITAL_COMMUNITY): Payer: Self-pay

## 2022-12-13 ENCOUNTER — Other Ambulatory Visit: Payer: Self-pay

## 2022-12-13 NOTE — Telephone Encounter (Signed)
Pharmacy Patient Advocate Encounter   Received notification from Phone that prior authorization for BOTOX 100 is required/requested.   Insurance verification completed.   The patient is insured through [facility] .   Per test claim: PA required; PA submitted to [facility] via CoverMyMeds Key/confirmation #/EOC [id number] Status is pending

## 2022-12-13 NOTE — Telephone Encounter (Signed)
Pharmacy Patient Advocate Encounter  Received notification from [facility] that Prior Authorization for BOTOX 100 has been APPROVED from 10.18.24 to 1.18.25. Ran test claim, Copay is $150. This test claim was processed through [facility]- copay amounts may vary at other pharmacies due to pharmacy/plan contracts, or as the patient moves through the different stages of their insurance plan.   PA #/Case ID/Reference #: [id number]

## 2022-12-16 ENCOUNTER — Other Ambulatory Visit (HOSPITAL_COMMUNITY): Payer: Self-pay

## 2022-12-20 ENCOUNTER — Encounter: Payer: Self-pay | Admitting: Neurology

## 2022-12-20 ENCOUNTER — Ambulatory Visit: Payer: Medicare Other | Admitting: Neurology

## 2022-12-20 DIAGNOSIS — Z029 Encounter for administrative examinations, unspecified: Secondary | ICD-10-CM

## 2022-12-27 ENCOUNTER — Ambulatory Visit (INDEPENDENT_AMBULATORY_CARE_PROVIDER_SITE_OTHER): Payer: Medicare Other | Admitting: Neurology

## 2022-12-27 DIAGNOSIS — G243 Spasmodic torticollis: Secondary | ICD-10-CM

## 2022-12-27 MED ORDER — ONABOTULINUMTOXINA 100 UNITS IJ SOLR
300.0000 [IU] | Freq: Once | INTRAMUSCULAR | Status: AC
  Administered 2022-12-27: 230 [IU] via INTRAMUSCULAR

## 2022-12-27 NOTE — Procedures (Signed)
Botulinum Clinic   Procedure Note Botox  Attending: Dr. Lurena Joiner Yamileth Hayse  Preoperative Diagnosis(es): Cervical Dystonia  Result History  Doing well  Consent obtained from: Family Benefits discussed included, but were not limited to decreased muscle tightness, increased joint range of motion, and decreased pain.  Risk discussed included, but were not limited pain and discomfort, bleeding, bruising, excessive weakness, venous thrombosis, muscle atrophy and dysphagia.  A copy of the patient medication guide was given to the patient which explains the blackbox warning.  Patients identity and treatment sites confirmed Yes.  .  Details of Procedure: Skin was cleaned with alcohol.  A 30 gauge, 25mm  needle was introduced to the target muscle, except for posterior splenius where 27 gauge, 1.5 inch needle used.   Prior to injection, the needle plunger was aspirated to make sure the needle was not within a blood vessel.  There was no blood retrieved on aspiration.    Following is a summary of the muscles injected  And the amount of Botulinum toxin used:   Dilution 0.9% preservative free saline mixed with 100 u Botox type A to make 10 U per 0.1cc  Injections  Location Left  Right Units Number of sites        Sternocleidomastoid 30 70 100 2  Splenius Capitus, posterior approach 100  100 1  Splenius Capitus, lateral approach 30  30 1   Levator Scapulae      Trapezius            TOTAL UNITS:   230    Agent: Botulinum Type A ( Onobotulinum Toxin type A ). 3 vials of Botox were used, each containing 100 units and freshly diluted with 1 mL of sterile, non-preserved saline   Total injected (Units): 230  Total wasted (Units): 70   Pt tolerated procedure well without complications.   Reinjection is anticipated in 3 months.

## 2023-01-01 ENCOUNTER — Other Ambulatory Visit: Payer: Self-pay

## 2023-01-03 ENCOUNTER — Encounter: Payer: Self-pay | Admitting: Neurology

## 2023-03-05 ENCOUNTER — Other Ambulatory Visit: Payer: Self-pay

## 2023-03-21 ENCOUNTER — Other Ambulatory Visit: Payer: Self-pay

## 2023-03-21 ENCOUNTER — Ambulatory Visit: Payer: Medicare Other | Admitting: Neurology

## 2023-03-24 ENCOUNTER — Other Ambulatory Visit: Payer: Self-pay

## 2023-03-26 ENCOUNTER — Other Ambulatory Visit: Payer: Self-pay

## 2023-03-26 NOTE — Progress Notes (Signed)
Specialty Pharmacy Refill Coordination Note  Carol Levy is a 78 y.o. female contacted today regarding refills of specialty medication(s) OnabotulinumtoxinA (Botox)   Patient requested Courier to Provider Office   Delivery date: 03/31/23   Verified address: [facility] [address] [place], [place], [address]   Medication will be filled on 03/28/23.

## 2023-03-27 ENCOUNTER — Other Ambulatory Visit (HOSPITAL_COMMUNITY): Payer: Self-pay

## 2023-03-28 ENCOUNTER — Telehealth: Payer: Self-pay | Admitting: Pharmacy Technician

## 2023-03-28 ENCOUNTER — Other Ambulatory Visit (HOSPITAL_COMMUNITY): Payer: Self-pay

## 2023-03-28 ENCOUNTER — Other Ambulatory Visit: Payer: Self-pay

## 2023-03-28 ENCOUNTER — Ambulatory Visit: Payer: Medicare Other | Admitting: Neurology

## 2023-03-28 NOTE — Telephone Encounter (Signed)
Pharmacy Patient Advocate Encounter- Botox BIV-Pharmacy Benefit:  PA was submitted to [employer] and has been approved through: 1.31.25 TO 4.30.25 Authorization# [id number]  Please send prescription to Specialty Pharmacy: [facility]: [phone]  Estimated Copay is: $150    Patient IS NOT eligible for Botox Copay Card, which will make patient's copay as little as zero. Copay card will be provided to pharmacy.

## 2023-03-28 NOTE — Telephone Encounter (Signed)
Pharmacy Patient Advocate Encounter  [employer] verification has been submitted. Benefit Verification #:  [id number]  Pharmacy PA has been submitted for BOTOX 100u via CoverMyMeds. INSURANCE: [employer] DATE SUBMITTED: 1.31.25 KEY: [id number] Status is pending

## 2023-03-28 NOTE — Telephone Encounter (Signed)
Hi Monchell I called [facility] and they said they have this patients botox coming on the 3rd is this not the correct pharmacy for this patient ?

## 2023-04-04 ENCOUNTER — Ambulatory Visit (INDEPENDENT_AMBULATORY_CARE_PROVIDER_SITE_OTHER): Payer: Medicare Other | Admitting: Neurology

## 2023-04-04 DIAGNOSIS — G243 Spasmodic torticollis: Secondary | ICD-10-CM

## 2023-04-04 MED ORDER — ONABOTULINUMTOXINA 100 UNITS IJ SOLR
300.0000 [IU] | Freq: Once | INTRAMUSCULAR | Status: AC
  Administered 2023-04-04: 230 [IU] via INTRAMUSCULAR

## 2023-04-04 NOTE — Procedures (Signed)
 Botulinum Clinic   Procedure Note Botox   Attending: Dr. Asberry Imelda Dandridge  Preoperative Diagnosis(es): Cervical Dystonia  Result History  Doing well/stable  Consent obtained from: Family Benefits discussed included, but were not limited to decreased muscle tightness, increased joint range of motion, and decreased pain.  Risk discussed included, but were not limited pain and discomfort, bleeding, bruising, excessive weakness, venous thrombosis, muscle atrophy and dysphagia.  A copy of the patient medication guide was given to the patient which explains the blackbox warning.  Patients identity and treatment sites confirmed Yes.  .  Details of Procedure: Skin was cleaned with alcohol.  A 30 gauge, 25mm  needle was introduced to the target muscle, except for posterior splenius where 27 gauge, 1.5 inch needle used.   Prior to injection, the needle plunger was aspirated to make sure the needle was not within a blood vessel.  There was no blood retrieved on aspiration.    Following is a summary of the muscles injected  And the amount of Botulinum toxin used:   Dilution 0.9% preservative free saline mixed with 100 u Botox  type A to make 10 U per 0.1cc  Injections  Location Left  Right Units Number of sites        Sternocleidomastoid 30 70 100 2  Splenius Capitus, posterior approach 100  100 1  Splenius Capitus, lateral approach 30  30 1   Levator Scapulae      Trapezius            TOTAL UNITS:   230    Agent: Botulinum Type A ( Onobotulinum Toxin type A ). 3 vials of Botox  were used, each containing 100 units and freshly diluted with 1 mL of sterile, non-preserved saline   Total injected (Units): 230  Total wasted (Units): 70   Pt tolerated procedure well without complications.   Reinjection is anticipated in 3 months.

## 2023-05-05 ENCOUNTER — Other Ambulatory Visit: Payer: Self-pay | Admitting: Family Medicine

## 2023-05-05 DIAGNOSIS — Z1231 Encounter for screening mammogram for malignant neoplasm of breast: Secondary | ICD-10-CM

## 2023-05-22 ENCOUNTER — Ambulatory Visit

## 2023-06-05 ENCOUNTER — Ambulatory Visit
Admission: RE | Admit: 2023-06-05 | Discharge: 2023-06-05 | Disposition: A | Discharge status: Home / Self Care | Source: Ambulatory Visit | Attending: Family Medicine | Admitting: Family Medicine

## 2023-06-05 ENCOUNTER — Other Ambulatory Visit: Payer: Self-pay | Admitting: Family Medicine

## 2023-06-05 DIAGNOSIS — Z1231 Encounter for screening mammogram for malignant neoplasm of breast: Secondary | ICD-10-CM

## 2023-06-10 ENCOUNTER — Other Ambulatory Visit: Payer: Self-pay | Admitting: Family Medicine

## 2023-06-10 DIAGNOSIS — E2839 Other primary ovarian failure: Secondary | ICD-10-CM

## 2023-06-17 ENCOUNTER — Other Ambulatory Visit: Payer: Self-pay

## 2023-06-19 ENCOUNTER — Other Ambulatory Visit: Payer: Self-pay | Admitting: Pharmacy Technician

## 2023-06-19 ENCOUNTER — Other Ambulatory Visit: Payer: Self-pay | Admitting: Neurology

## 2023-06-19 ENCOUNTER — Other Ambulatory Visit: Payer: Self-pay

## 2023-06-19 DIAGNOSIS — G243 Spasmodic torticollis: Secondary | ICD-10-CM

## 2023-06-19 NOTE — Progress Notes (Signed)
 Specialty Pharmacy Refill Coordination Note  Carol Levy is a 78 y.o. female contacted today regarding refills of specialty medication(s) OnabotulinumtoxinA  (Botox )   Patient requested Courier to Provider Office   Delivery date: 07/09/23   Verified address: [facility]  [address], [place], [place] [address]   Medication will be filled on 07/09/23.  This fill date is pending response to refill request from provider. Patient is aware and if they have not received fill by intended date they must follow up with pharmacy.

## 2023-06-20 ENCOUNTER — Other Ambulatory Visit: Payer: Self-pay

## 2023-06-20 MED ORDER — BOTOX 100 UNITS IJ SOLR
INTRAMUSCULAR | 1 refills | Status: DC
Start: 2023-06-20 — End: 2024-01-01
  Filled 2023-06-20: qty 3, 90d supply, fill #0
  Filled 2023-09-30 – 2023-10-09 (×2): qty 3, 90d supply, fill #1

## 2023-06-27 ENCOUNTER — Other Ambulatory Visit: Payer: Self-pay

## 2023-07-09 ENCOUNTER — Other Ambulatory Visit (HOSPITAL_COMMUNITY): Payer: Self-pay

## 2023-07-10 ENCOUNTER — Other Ambulatory Visit (HOSPITAL_COMMUNITY): Payer: Self-pay

## 2023-07-14 ENCOUNTER — Telehealth: Payer: Self-pay | Admitting: Pharmacy Technician

## 2023-07-14 ENCOUNTER — Other Ambulatory Visit: Payer: Self-pay

## 2023-07-14 ENCOUNTER — Other Ambulatory Visit (HOSPITAL_COMMUNITY): Payer: Self-pay

## 2023-07-14 NOTE — Telephone Encounter (Signed)
 Pharmacy Patient Advocate Encounter  Pharmacy Benefit PA has been submitted for Botox - [id number] via CoverMyMeds.  INSURANCE: [employer] KEY/EOC/FAX: [id number] Procedure code 62952  Does Not require a PA Status is Pending

## 2023-07-14 NOTE — Progress Notes (Signed)
 PA has been approved

## 2023-07-14 NOTE — Progress Notes (Signed)
 PA has been submitted, and telephone encounter has been created. Please see telephone encounter dated 5.19.25.

## 2023-07-14 NOTE — Telephone Encounter (Signed)
 Pharmacy Patient Advocate Encounter- Injection via Pharmacy Benefit:  PA was submitted  for Botox - [id number] to [facility] and has been approved through: 5.19.25 to 8.19.25 Authorization# [id number]  Please send prescription to Specialty Pharmacy: [facility]: [phone]  Estimated Pharmacy Copay is: $150  Patient IS NOT eligible for Botox - J0585 Copay Card, which will make patient's copay as little as zero. Copay card will be provided to pharmacy.   Admin Code: [id number]   Does Not require Prior Auth.

## 2023-07-18 ENCOUNTER — Ambulatory Visit (INDEPENDENT_AMBULATORY_CARE_PROVIDER_SITE_OTHER): Payer: Medicare Other | Admitting: Neurology

## 2023-07-18 DIAGNOSIS — G243 Spasmodic torticollis: Secondary | ICD-10-CM | POA: Diagnosis not present

## 2023-07-18 MED ORDER — ONABOTULINUMTOXINA 100 UNITS IJ SOLR
300.0000 [IU] | Freq: Once | INTRAMUSCULAR | Status: AC
  Administered 2023-07-18: 230 [IU] via INTRAMUSCULAR

## 2023-07-18 NOTE — Procedures (Signed)
 Botulinum Clinic   Procedure Note Botox   Attending: Dr. Ivette Marks Latitia Housewright  Preoperative Diagnosis(es): Cervical Dystonia  Result History  Stable.  Happy with injections.  Notes tremor but notes that injections help  Consent obtained from: Family Benefits discussed included, but were not limited to decreased muscle tightness, increased joint range of motion, and decreased pain.  Risk discussed included, but were not limited pain and discomfort, bleeding, bruising, excessive weakness, venous thrombosis, muscle atrophy and dysphagia.  A copy of the patient medication guide was given to the patient which explains the blackbox warning.  Patients identity and treatment sites confirmed Yes.  .  Details of Procedure: Skin was cleaned with alcohol.  A 30 gauge, 25mm  needle was introduced to the target muscle, except for posterior splenius where 27 gauge, 1.5 inch needle used.   Prior to injection, the needle plunger was aspirated to make sure the needle was not within a blood vessel.  There was no blood retrieved on aspiration.    Following is a summary of the muscles injected  And the amount of Botulinum toxin used:   Dilution 0.9% preservative free saline mixed with 100 u Botox  type A to make 10 U per 0.1cc  Injections  Location Left  Right Units Number of sites        Sternocleidomastoid 30 70 100 2  Splenius Capitus, posterior approach 100  100 1  Splenius Capitus, lateral approach 30  30 1   Levator Scapulae      Trapezius            TOTAL UNITS:   230    Agent: Botulinum Type A ( Onobotulinum Toxin type A ). 3 vials of Botox  were used, each containing 100 units and freshly diluted with 1 mL of sterile, non-preserved saline   Total injected (Units): 230  Total wasted (Units): 70   Pt tolerated procedure well without complications.   Reinjection is anticipated in 3 months.

## 2023-08-14 ENCOUNTER — Ambulatory Visit (HOSPITAL_BASED_OUTPATIENT_CLINIC_OR_DEPARTMENT_OTHER)
Admission: RE | Admit: 2023-08-14 | Discharge: 2023-08-14 | Disposition: A | Discharge status: Home / Self Care | Source: Ambulatory Visit | Attending: Family Medicine | Admitting: Family Medicine

## 2023-08-14 DIAGNOSIS — E2839 Other primary ovarian failure: Secondary | ICD-10-CM | POA: Diagnosis present

## 2023-09-30 ENCOUNTER — Other Ambulatory Visit: Payer: Self-pay

## 2023-10-07 ENCOUNTER — Other Ambulatory Visit: Payer: Self-pay

## 2023-10-08 ENCOUNTER — Other Ambulatory Visit: Payer: Self-pay

## 2023-10-09 ENCOUNTER — Other Ambulatory Visit: Payer: Self-pay

## 2023-10-09 NOTE — Progress Notes (Signed)
 Specialty Pharmacy Refill Coordination Note  Carol Levy is a 78 y.o. female contacted today regarding refills of specialty medication(s) OnabotulinumtoxinA  (Botox )   Patient requested Courier to Provider Office   Delivery date: 10/13/23   Verified address: [facility]  [address], [place], [place] [address]   Medication will be filled on 10/10/23.

## 2023-10-17 ENCOUNTER — Ambulatory Visit (INDEPENDENT_AMBULATORY_CARE_PROVIDER_SITE_OTHER): Admitting: Neurology

## 2023-10-17 DIAGNOSIS — G243 Spasmodic torticollis: Secondary | ICD-10-CM | POA: Diagnosis not present

## 2023-10-17 MED ORDER — ONABOTULINUMTOXINA 100 UNITS IJ SOLR
300.0000 [IU] | Freq: Once | INTRAMUSCULAR | Status: AC
  Administered 2023-10-17: 230 [IU] via INTRAMUSCULAR

## 2023-10-17 NOTE — Procedures (Signed)
 Botulinum Clinic   Procedure Note Botox   Attending: Dr. Asberry Dareld Mcauliffe  Preoperative Diagnosis(es): Cervical Dystonia  Result History  Doing well.  No complications.  Consent obtained from: patient  Patients identity and treatment sites confirmed Yes.  .  Details of Procedure: Skin was cleaned with alcohol.  A 30 gauge, 25mm  needle was introduced to the target muscle, except for posterior splenius where 27 gauge, 1.5 inch needle used.   Prior to injection, the needle plunger was aspirated to make sure the needle was not within a blood vessel.  There was no blood retrieved on aspiration.    Following is a summary of the muscles injected  And the amount of Botulinum toxin used:   Dilution 0.9% preservative free saline mixed with 100 u Botox  type A to make 10 U per 0.1cc  Injections  Location Left  Right Units Number of sites        Sternocleidomastoid 30 70 100 2  Splenius Capitus, posterior approach 100  100 1  Splenius Capitus, lateral approach 30  30 1   Levator Scapulae      Trapezius            TOTAL UNITS:   230    Agent: Botulinum Type A ( Onobotulinum Toxin type A ). 3 vials of Botox  were used, each containing 100 units and freshly diluted with 1 mL of sterile, non-preserved saline   Total injected (Units): 230  Total wasted (Units): 70   Pt tolerated procedure well without complications.   Reinjection is anticipated in 3 months.

## 2023-12-19 ENCOUNTER — Other Ambulatory Visit (HOSPITAL_COMMUNITY): Payer: Self-pay

## 2023-12-22 ENCOUNTER — Other Ambulatory Visit (HOSPITAL_COMMUNITY): Payer: Self-pay

## 2024-01-01 ENCOUNTER — Other Ambulatory Visit: Payer: Self-pay

## 2024-01-01 ENCOUNTER — Other Ambulatory Visit: Payer: Self-pay | Admitting: Neurology

## 2024-01-01 ENCOUNTER — Other Ambulatory Visit (HOSPITAL_COMMUNITY): Payer: Self-pay

## 2024-01-01 DIAGNOSIS — G243 Spasmodic torticollis: Secondary | ICD-10-CM

## 2024-01-01 MED ORDER — BOTOX 100 UNITS IJ SOLR
INTRAMUSCULAR | 1 refills | Status: AC
  Filled 2024-01-01: qty 3, fill #0
  Filled 2024-01-15 – 2024-01-16 (×2): qty 3, 90d supply, fill #0

## 2024-01-15 ENCOUNTER — Other Ambulatory Visit (HOSPITAL_COMMUNITY): Payer: Self-pay

## 2024-01-15 ENCOUNTER — Other Ambulatory Visit: Payer: Self-pay

## 2024-01-15 NOTE — Progress Notes (Signed)
 Benefits Investigation Started  Reason: Prior Authorization Required  Routed to: Rx Prior Auth Team (ATTN: Monchell)

## 2024-01-16 ENCOUNTER — Other Ambulatory Visit: Payer: Self-pay

## 2024-01-16 ENCOUNTER — Telehealth: Payer: Self-pay | Admitting: Pharmacy Technician

## 2024-01-16 ENCOUNTER — Ambulatory Visit: Admitting: Neurology

## 2024-01-16 ENCOUNTER — Other Ambulatory Visit (HOSPITAL_COMMUNITY): Payer: Self-pay

## 2024-01-16 NOTE — Progress Notes (Signed)
 Specialty Pharmacy Refill Coordination Note  Carol Levy is a 78 y.o. female assessed today regarding refills of clinic administered specialty medication(s) OnabotulinumtoxinA  (Botox )   Clinic requested Courier to Provider Office   Delivery date: 01/26/24   Verified address: [facility]  [address] [place] [address], [place], [place] [address]   Medication will be filled on: 01/23/24  Copay: $0.00 Appointment: 12.05.25

## 2024-01-16 NOTE — Telephone Encounter (Signed)
 Pharmacy Patient Advocate Encounter  Received notification from [employer] that Prior Authorization for BOTOX  100 has been APPROVED from 11.21.25 to 12.31.26. Ran test claim, Copay is $0. This test claim was processed through [facility]- copay amounts may vary at other pharmacies due to pharmacy/plan contracts, or as the patient moves through the different stages of their insurance plan.   PA #/Case ID/Reference #: [id number]

## 2024-01-16 NOTE — Telephone Encounter (Signed)
 Pharmacy Patient Advocate Encounter   Received notification from Pt Calls Messages that prior authorization for BOTOX  100 is required/requested.   Insurance verification completed.   The patient is insured through [facility].   Per test claim: PA required; PA submitted to above mentioned insurance via Latent Key/confirmation #/EOC [id number] Status is pending

## 2024-01-16 NOTE — Progress Notes (Signed)
 PA has been submitted, and telephone encounter has been created. Please see telephone encounter dated 11.21.25.

## 2024-01-19 ENCOUNTER — Other Ambulatory Visit: Payer: Self-pay

## 2024-01-30 ENCOUNTER — Ambulatory Visit (INDEPENDENT_AMBULATORY_CARE_PROVIDER_SITE_OTHER): Admitting: Neurology

## 2024-01-30 DIAGNOSIS — G243 Spasmodic torticollis: Secondary | ICD-10-CM

## 2024-01-30 MED ORDER — ONABOTULINUMTOXINA 100 UNITS IJ SOLR
300.0000 [IU] | Freq: Once | INTRAMUSCULAR | Status: AC
  Administered 2024-01-30: 230 [IU] via INTRAMUSCULAR

## 2024-01-30 NOTE — Procedures (Signed)
 Botulinum Clinic   Procedure Note Botox   Attending: Dr. Asberry Gitty Osterlund  Preoperative Diagnosis(es): Cervical Dystonia  Result History  Feels medication working well and pain/tremor well controlled  Consent obtained from: patient  Patients identity and treatment sites confirmed Yes.  .  Details of Procedure: Skin was cleaned with alcohol.  A 30 gauge, 25mm  needle was introduced to the target muscle, except for posterior splenius where 27 gauge, 1.5 inch needle used.   Prior to injection, the needle plunger was aspirated to make sure the needle was not within a blood vessel.  There was no blood retrieved on aspiration.    Following is a summary of the muscles injected  And the amount of Botulinum toxin used:   Dilution 0.9% preservative free saline mixed with 100 u Botox  type A to make 10 U per 0.1cc  Injections  Location Left  Right Units Number of sites        Sternocleidomastoid 30 70 100 2  Splenius Capitus, posterior approach 100  100 1  Splenius Capitus, lateral approach 30  30 1   Levator Scapulae      Trapezius            TOTAL UNITS:   230    Agent: Botulinum Type A ( Onobotulinum Toxin type A ). 3 vials of Botox  were used, each containing 100 units and freshly diluted with 1 mL of sterile, non-preserved saline   Total injected (Units): 230  Total wasted (Units): 70   Pt tolerated procedure well without complications.   Reinjection is anticipated in 3 months.

## 2024-02-27 ENCOUNTER — Other Ambulatory Visit

## 2024-04-30 ENCOUNTER — Ambulatory Visit: Admitting: Neurology
